# Patient Record
Sex: Female | Born: 1955 | Race: White | Hispanic: No | Marital: Married | Smoking: Current every day smoker
Health system: Southern US, Community
[De-identification: ages and names within clinical notes are randomized; demographics above are authoritative.]

## PROBLEM LIST (undated history)

## (undated) DIAGNOSIS — I10 Essential (primary) hypertension: Secondary | ICD-10-CM

## (undated) DIAGNOSIS — J189 Pneumonia, unspecified organism: Secondary | ICD-10-CM

## (undated) DIAGNOSIS — K219 Gastro-esophageal reflux disease without esophagitis: Secondary | ICD-10-CM

## (undated) DIAGNOSIS — K589 Irritable bowel syndrome without diarrhea: Secondary | ICD-10-CM

## (undated) DIAGNOSIS — O24419 Gestational diabetes mellitus in pregnancy, unspecified control: Secondary | ICD-10-CM

## (undated) DIAGNOSIS — K5790 Diverticulosis of intestine, part unspecified, without perforation or abscess without bleeding: Secondary | ICD-10-CM

## (undated) DIAGNOSIS — H269 Unspecified cataract: Secondary | ICD-10-CM

## (undated) DIAGNOSIS — R7303 Prediabetes: Secondary | ICD-10-CM

## (undated) DIAGNOSIS — R1310 Dysphagia, unspecified: Secondary | ICD-10-CM

## (undated) DIAGNOSIS — R011 Cardiac murmur, unspecified: Secondary | ICD-10-CM

## (undated) DIAGNOSIS — T7840XA Allergy, unspecified, initial encounter: Secondary | ICD-10-CM

## (undated) DIAGNOSIS — F419 Anxiety disorder, unspecified: Secondary | ICD-10-CM

## (undated) HISTORY — DX: Diverticulosis of intestine, part unspecified, without perforation or abscess without bleeding: K57.90

## (undated) HISTORY — DX: Cardiac murmur, unspecified: R01.1

## (undated) HISTORY — PX: COLONOSCOPY: SHX174

## (undated) HISTORY — DX: Gastro-esophageal reflux disease without esophagitis: K21.9

## (undated) HISTORY — DX: Dysphagia, unspecified: R13.10

## (undated) HISTORY — DX: Unspecified cataract: H26.9

## (undated) HISTORY — DX: Prediabetes: R73.03

## (undated) HISTORY — DX: Essential (primary) hypertension: I10

## (undated) HISTORY — PX: UPPER GASTROINTESTINAL ENDOSCOPY: SHX188

## (undated) HISTORY — DX: Irritable bowel syndrome, unspecified: K58.9

## (undated) HISTORY — DX: Gestational diabetes mellitus in pregnancy, unspecified control: O24.419

## (undated) HISTORY — DX: Anxiety disorder, unspecified: F41.9

## (undated) HISTORY — DX: Pneumonia, unspecified organism: J18.9

## (undated) HISTORY — DX: Allergy, unspecified, initial encounter: T78.40XA

---

## 1997-10-02 ENCOUNTER — Other Ambulatory Visit: Admission: RE | Admit: 1997-10-02 | Discharge: 1997-10-02 | Payer: Self-pay | Admitting: Gynecology

## 1999-02-20 ENCOUNTER — Other Ambulatory Visit: Admission: RE | Admit: 1999-02-20 | Discharge: 1999-02-20 | Payer: Self-pay | Admitting: Gynecology

## 1999-11-10 ENCOUNTER — Other Ambulatory Visit: Admission: RE | Admit: 1999-11-10 | Discharge: 1999-11-10 | Payer: Self-pay | Admitting: Gastroenterology

## 2000-05-06 ENCOUNTER — Encounter: Payer: Self-pay | Admitting: Gastroenterology

## 2000-05-06 ENCOUNTER — Ambulatory Visit (HOSPITAL_COMMUNITY): Admission: RE | Admit: 2000-05-06 | Discharge: 2000-05-06 | Payer: Self-pay | Admitting: Gastroenterology

## 2000-05-25 ENCOUNTER — Other Ambulatory Visit: Admission: RE | Admit: 2000-05-25 | Discharge: 2000-05-25 | Payer: Self-pay | Admitting: Gynecology

## 2000-05-25 ENCOUNTER — Encounter: Payer: Self-pay | Admitting: Gastroenterology

## 2000-05-25 ENCOUNTER — Ambulatory Visit (HOSPITAL_COMMUNITY): Admission: RE | Admit: 2000-05-25 | Discharge: 2000-05-25 | Payer: Self-pay | Admitting: Gastroenterology

## 2000-06-02 ENCOUNTER — Encounter: Payer: Self-pay | Admitting: Gastroenterology

## 2000-06-02 ENCOUNTER — Ambulatory Visit (HOSPITAL_COMMUNITY): Admission: RE | Admit: 2000-06-02 | Discharge: 2000-06-02 | Payer: Self-pay | Admitting: Cardiology

## 2000-06-14 ENCOUNTER — Encounter: Payer: Self-pay | Admitting: Gastroenterology

## 2000-06-14 ENCOUNTER — Ambulatory Visit (HOSPITAL_COMMUNITY): Admission: RE | Admit: 2000-06-14 | Discharge: 2000-06-14 | Payer: Self-pay | Admitting: Gastroenterology

## 2002-06-29 ENCOUNTER — Other Ambulatory Visit: Admission: RE | Admit: 2002-06-29 | Discharge: 2002-06-29 | Payer: Self-pay | Admitting: Gynecology

## 2002-10-17 ENCOUNTER — Other Ambulatory Visit: Admission: RE | Admit: 2002-10-17 | Discharge: 2002-10-17 | Payer: Self-pay | Admitting: Gynecology

## 2004-02-21 ENCOUNTER — Other Ambulatory Visit: Admission: RE | Admit: 2004-02-21 | Discharge: 2004-02-21 | Payer: Self-pay | Admitting: Gynecology

## 2005-01-12 ENCOUNTER — Other Ambulatory Visit: Admission: RE | Admit: 2005-01-12 | Discharge: 2005-01-12 | Payer: Self-pay | Admitting: Gynecology

## 2005-12-09 ENCOUNTER — Encounter: Payer: Self-pay | Admitting: Gastroenterology

## 2006-01-26 ENCOUNTER — Ambulatory Visit: Payer: Self-pay | Admitting: Gastroenterology

## 2006-02-09 ENCOUNTER — Ambulatory Visit: Payer: Self-pay | Admitting: Gastroenterology

## 2006-02-16 ENCOUNTER — Other Ambulatory Visit: Admission: RE | Admit: 2006-02-16 | Discharge: 2006-02-16 | Payer: Self-pay | Admitting: Gynecology

## 2007-08-15 ENCOUNTER — Other Ambulatory Visit: Admission: RE | Admit: 2007-08-15 | Discharge: 2007-08-15 | Payer: Self-pay | Admitting: Gynecology

## 2008-05-15 ENCOUNTER — Telehealth: Payer: Self-pay | Admitting: Gastroenterology

## 2008-05-15 ENCOUNTER — Ambulatory Visit: Payer: Self-pay | Admitting: Internal Medicine

## 2008-05-15 DIAGNOSIS — K5732 Diverticulitis of large intestine without perforation or abscess without bleeding: Secondary | ICD-10-CM | POA: Insufficient documentation

## 2008-05-15 DIAGNOSIS — R1032 Left lower quadrant pain: Secondary | ICD-10-CM | POA: Insufficient documentation

## 2008-05-15 LAB — CONVERTED CEMR LAB
Basophils Absolute: 0.1 10*3/uL (ref 0.0–0.1)
Basophils Relative: 0.6 % (ref 0.0–3.0)
Eosinophils Absolute: 0.3 10*3/uL (ref 0.0–0.7)
Eosinophils Relative: 3.2 % (ref 0.0–5.0)
HCT: 37.6 % (ref 36.0–46.0)
Hemoglobin: 13 g/dL (ref 12.0–15.0)
Lymphocytes Relative: 32.6 % (ref 12.0–46.0)
MCHC: 34.5 g/dL (ref 30.0–36.0)
MCV: 95.6 fL (ref 78.0–100.0)
Monocytes Absolute: 0.6 10*3/uL (ref 0.1–1.0)
Monocytes Relative: 7.1 % (ref 3.0–12.0)
Neutro Abs: 4.9 10*3/uL (ref 1.4–7.7)
Neutrophils Relative %: 56.5 % (ref 43.0–77.0)
Platelets: 187 10*3/uL (ref 150–400)
RBC: 3.94 M/uL (ref 3.87–5.11)
RDW: 12.5 % (ref 11.5–14.6)
Sed Rate: 13 mm/hr (ref 0–22)
WBC: 8.7 10*3/uL (ref 4.5–10.5)

## 2008-05-16 ENCOUNTER — Ambulatory Visit (HOSPITAL_COMMUNITY): Admission: RE | Admit: 2008-05-16 | Discharge: 2008-05-16 | Payer: Self-pay | Admitting: Internal Medicine

## 2008-05-21 ENCOUNTER — Telehealth: Payer: Self-pay | Admitting: Gastroenterology

## 2008-05-21 ENCOUNTER — Encounter: Payer: Self-pay | Admitting: Gastroenterology

## 2008-06-20 ENCOUNTER — Ambulatory Visit: Payer: Self-pay | Admitting: Gastroenterology

## 2008-06-20 DIAGNOSIS — R109 Unspecified abdominal pain: Secondary | ICD-10-CM | POA: Insufficient documentation

## 2008-08-02 ENCOUNTER — Telehealth: Payer: Self-pay | Admitting: Gastroenterology

## 2008-08-02 ENCOUNTER — Ambulatory Visit: Payer: Self-pay | Admitting: Gastroenterology

## 2008-08-03 ENCOUNTER — Telehealth: Payer: Self-pay | Admitting: Gastroenterology

## 2008-08-15 ENCOUNTER — Ambulatory Visit: Payer: Self-pay | Admitting: Gastroenterology

## 2008-08-15 DIAGNOSIS — K222 Esophageal obstruction: Secondary | ICD-10-CM | POA: Insufficient documentation

## 2010-06-30 ENCOUNTER — Telehealth: Payer: Self-pay | Admitting: Gastroenterology

## 2010-07-09 NOTE — Progress Notes (Signed)
Summary: speak directly to nurse  Phone Note Call from Patient Call back at Work Phone [phone]   Caller: Patient Call For: Dr Arlyce Dice Reason for Call: Talk to Nurse Complaint: Breathing Problems Summary of Call: Patient wants to speak directly to nurse Initial call taken by: Tawni Levy,  June 30, 2010 1:46 PM  Follow-up for Phone Call        Patient states that she started having quite a bit of pain on her left side this weekend, states she usually has it but it was worse. She started Cipro 500mg  Sat night and continued two times a day until this am. She is also taking Hydrocodone 10/325 every 4-5 hours and still states that she feels the pain. Patient wants to make sure she is doing what she needs to and wanted to find out how long she should take the Cipro. Dr. Arlyce Dice please advise. Follow-up by: Selinda Michaels RN,  June 30, 2010 2:04 PM  Additional Follow-up for Phone Call Additional follow up Details #1::        I'm not convinced that she has diverticulitis. Get CT ASAP; if negative d/c antibiotics Additional Follow-up by: Louis Meckel MD,  June 30, 2010 4:24 PM    Additional Follow-up for Phone Call Additional follow up Details #2::    Left message to call back  Spoke with patient and she states she is feeling better this morning. Patient wants to continue to wait and see if she continues to improve, states if she does not she will call us back to have CT scan. Selinda Michaels, RN 07/01/10@9 :53am Follow-up by: Selinda Michaels RN,  June 30, 2010 4:36 PM  Additional Follow-up for Phone Call Additional follow up Details #3:: Details for Additional Follow-up Action Taken: lets hold off her cipro, then Additional Follow-up by: Louis Meckel MD,  July 01, 2010 3:02 PM   Appended Document: speak directly to nurse Patient notified to stop her Cipro. Patient will call if pain continues and thinks she needs the CT scan.

## 2010-10-03 NOTE — Assessment & Plan Note (Signed)
[facility]                           GASTROENTEROLOGY OFFICE NOTE   JAYLEI, FUERTE                      MRN:          [id number]  DATE:01/26/2006                            DOB:          1955-09-18    PROBLEM:  Diarrhea.   Ms. Weinheimer is a pleasant 55 year old white female referred through the  courtesy of Dr. Samuel Germany for evaluation.  Over the last 4 months, she has been  complaining of abdominal bloating, occasional upper abdominal pain and  diarrhea.  She is now having 1-3 poorly formed, or frankly loose stools  daily.  She has occasional urgency.  There is no history of melena or  hematochezia.  She does have a history of IBS.  She underwent extensive  workup including upper and lower endoscopy, random colon biopsies, abdominal  ultrasound and an upper GI and small-bowel follow-through and CT scan.  No  specific abnormalities were seen except for a hiatal hernia.  Stool studies  for O&P and C&S and C. difficile toxin were negative.  She was placed on  Nexium twice a day without improvement in her symptoms.  Lab work was also  unremarkable, except for an elevated C-reactive protein (25.2).  CBC and  CMET were normal.  She has actually lost 20 pounds in the last few months.  Appetite is just fair.  Altogether, she is just not feeling well.   PAST MEDICAL HISTORY:  Unremarkable.  She is status post C-section.   FAMILY HISTORY:  Noncontributory.   MEDICATIONS:  1. Nexium 40 mg twice a day.  2. Ambien.   ALLERGIES:  She is allergic to IVP DYE and Lasix.   She neither smokes nor drinks.  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   REVIEW OF SYSTEMS:  Positive for insomnia and mild anxiety.   PHYSICAL EXAMINATION:  VITAL SIGNS:  Pulse is 76, blood pressure 110/70.  Weight 193.   PHYSICAL EXAMINATION:  HEENT: EOMI. PERRLA. Sclerae are anicteric.  Conjunctivae are pink.  NECK:  Supple without thyromegaly, adenopathy or  carotid bruits.  CHEST:  Clear to auscultation and percussion without adventitious sounds.  CARDIAC:  Regular rhythm; normal S1 S2.  There are no murmurs, gallops or  rubs.  ABDOMEN:  She has a small umbilical hernia.  There is mild right and left  lower quadrant tenderness without guarding or rebound.  There are no  abdominal masses or organomegaly.  EXTREMITIES:  Full range of motion.  No cyanosis, clubbing or edema.  RECTAL:  Deferred.   IMPRESSION:  Change in bowel habits consisting of mild chronic diarrhea with  weight loss and abdominal bloating.  Extensive workup has been negative to  date.  Irritable bowel syndrome is certainly a possibility.  High-dose  Nexium may be contributory to symptoms of nausea, pain and diarrhea.   RECOMMENDATION:  1. Discontinue Nexium.  2. Trial of  Xifaxan 400 mg twice a day.  Barbette Hair. Arlyce Dice, MD,FACG   RDK/MedQ  DD:  01/26/2006  DT:  01/27/2006  Job #:  [id number]   cc:   Renae Fickle

## 2010-10-03 NOTE — Assessment & Plan Note (Signed)
[facility]                           GASTROENTEROLOGY OFFICE NOTE   RANDEE, HUSTON                      MRN:          [id number]  DATE:02/09/2006                            DOB:          [birth year]    PROBLEM:  Diarrhea, resolved.   Mrs. Sigler has returned for a scheduled GI follow-up.  Diarrhea has  entirely subsided.  She no longer has bloating.  Since discontinuing her  Nexium, she has developed nausea and pyrosis.  She completed a 14 day course  of  Xifaxan.   PHYSICAL EXAMINATION:  VITAL SIGNS:  Pulse 82, blood pressure 110/64, weight  188.   IMPRESSION:  1. Diarrhea - resolved:  This could be due to irritable bowel syndrome.      Nexium may also be a causative factor.  2. Pyrosis and nausea:  I suspect this is related to acid reflux.   RECOMMENDATIONS:  Resume Nexium 40 mg daily.  I carefully instructed Mrs.  Weyenberg to contact me if her diarrhea and bloating recur or if he nausea and  pyrosis do not subside.  Should the latter be the case, then I would  consider a gastric emptying scan.                                   Barbette Hair. Arlyce Dice, MD,FACG   RDK/MedQ  DD:  02/09/2006  DT:  02/11/2006  Job #:  [id number]   cc:   Renae Fickle

## 2014-03-19 ENCOUNTER — Encounter: Payer: Self-pay | Admitting: Gastroenterology

## 2015-09-17 DIAGNOSIS — J189 Pneumonia, unspecified organism: Secondary | ICD-10-CM | POA: Diagnosis not present

## 2015-09-17 DIAGNOSIS — M545 Low back pain: Secondary | ICD-10-CM | POA: Diagnosis not present

## 2015-09-17 DIAGNOSIS — J9801 Acute bronchospasm: Secondary | ICD-10-CM | POA: Diagnosis not present

## 2015-09-17 DIAGNOSIS — E669 Obesity, unspecified: Secondary | ICD-10-CM | POA: Diagnosis not present

## 2016-01-09 DIAGNOSIS — T148 Other injury of unspecified body region: Secondary | ICD-10-CM | POA: Diagnosis not present

## 2016-01-09 DIAGNOSIS — M545 Low back pain: Secondary | ICD-10-CM | POA: Diagnosis not present

## 2016-01-09 DIAGNOSIS — H6120 Impacted cerumen, unspecified ear: Secondary | ICD-10-CM | POA: Diagnosis not present

## 2016-01-09 DIAGNOSIS — R21 Rash and other nonspecific skin eruption: Secondary | ICD-10-CM | POA: Diagnosis not present

## 2016-01-09 DIAGNOSIS — B373 Candidiasis of vulva and vagina: Secondary | ICD-10-CM | POA: Diagnosis not present

## 2016-01-09 DIAGNOSIS — L819 Disorder of pigmentation, unspecified: Secondary | ICD-10-CM | POA: Diagnosis not present

## 2016-04-02 DIAGNOSIS — R079 Chest pain, unspecified: Secondary | ICD-10-CM | POA: Diagnosis not present

## 2016-04-02 DIAGNOSIS — R0602 Shortness of breath: Secondary | ICD-10-CM | POA: Diagnosis not present

## 2016-04-02 DIAGNOSIS — R0789 Other chest pain: Secondary | ICD-10-CM | POA: Diagnosis not present

## 2016-04-07 DIAGNOSIS — L570 Actinic keratosis: Secondary | ICD-10-CM | POA: Diagnosis not present

## 2016-04-07 DIAGNOSIS — M545 Low back pain: Secondary | ICD-10-CM | POA: Diagnosis not present

## 2016-04-07 DIAGNOSIS — Z6832 Body mass index (BMI) 32.0-32.9, adult: Secondary | ICD-10-CM | POA: Diagnosis not present

## 2016-04-07 DIAGNOSIS — Z7189 Other specified counseling: Secondary | ICD-10-CM | POA: Diagnosis not present

## 2016-07-07 DIAGNOSIS — F419 Anxiety disorder, unspecified: Secondary | ICD-10-CM | POA: Diagnosis not present

## 2016-07-07 DIAGNOSIS — E559 Vitamin D deficiency, unspecified: Secondary | ICD-10-CM | POA: Diagnosis not present

## 2016-07-07 DIAGNOSIS — B373 Candidiasis of vulva and vagina: Secondary | ICD-10-CM | POA: Diagnosis not present

## 2016-07-07 DIAGNOSIS — M545 Low back pain: Secondary | ICD-10-CM | POA: Diagnosis not present

## 2016-09-30 DIAGNOSIS — B373 Candidiasis of vulva and vagina: Secondary | ICD-10-CM | POA: Diagnosis not present

## 2016-09-30 DIAGNOSIS — E559 Vitamin D deficiency, unspecified: Secondary | ICD-10-CM | POA: Diagnosis not present

## 2016-09-30 DIAGNOSIS — R102 Pelvic and perineal pain: Secondary | ICD-10-CM | POA: Diagnosis not present

## 2016-09-30 DIAGNOSIS — E538 Deficiency of other specified B group vitamins: Secondary | ICD-10-CM | POA: Diagnosis not present

## 2016-09-30 DIAGNOSIS — Z23 Encounter for immunization: Secondary | ICD-10-CM | POA: Diagnosis not present

## 2016-09-30 DIAGNOSIS — J9801 Acute bronchospasm: Secondary | ICD-10-CM | POA: Diagnosis not present

## 2016-12-15 DIAGNOSIS — E559 Vitamin D deficiency, unspecified: Secondary | ICD-10-CM | POA: Diagnosis not present

## 2016-12-15 DIAGNOSIS — R739 Hyperglycemia, unspecified: Secondary | ICD-10-CM | POA: Diagnosis not present

## 2016-12-15 DIAGNOSIS — F419 Anxiety disorder, unspecified: Secondary | ICD-10-CM | POA: Diagnosis not present

## 2016-12-15 DIAGNOSIS — M545 Low back pain: Secondary | ICD-10-CM | POA: Diagnosis not present

## 2016-12-15 DIAGNOSIS — Z79899 Other long term (current) drug therapy: Secondary | ICD-10-CM | POA: Diagnosis not present

## 2016-12-15 DIAGNOSIS — R030 Elevated blood-pressure reading, without diagnosis of hypertension: Secondary | ICD-10-CM | POA: Diagnosis not present

## 2016-12-17 DIAGNOSIS — E538 Deficiency of other specified B group vitamins: Secondary | ICD-10-CM | POA: Diagnosis not present

## 2017-03-30 DIAGNOSIS — M545 Low back pain: Secondary | ICD-10-CM | POA: Diagnosis not present

## 2017-03-30 DIAGNOSIS — J9801 Acute bronchospasm: Secondary | ICD-10-CM | POA: Diagnosis not present

## 2017-03-30 DIAGNOSIS — R110 Nausea: Secondary | ICD-10-CM | POA: Diagnosis not present

## 2017-03-30 DIAGNOSIS — B373 Candidiasis of vulva and vagina: Secondary | ICD-10-CM | POA: Diagnosis not present

## 2017-04-19 ENCOUNTER — Encounter: Payer: Self-pay | Admitting: Physician Assistant

## 2017-04-26 ENCOUNTER — Ambulatory Visit: Payer: Self-pay | Admitting: Physician Assistant

## 2017-05-06 ENCOUNTER — Encounter: Payer: Self-pay | Admitting: Physician Assistant

## 2017-05-06 ENCOUNTER — Ambulatory Visit: Payer: BLUE CROSS/BLUE SHIELD | Admitting: Physician Assistant

## 2017-05-06 ENCOUNTER — Ambulatory Visit: Payer: Self-pay | Admitting: Physician Assistant

## 2017-05-06 ENCOUNTER — Other Ambulatory Visit (INDEPENDENT_AMBULATORY_CARE_PROVIDER_SITE_OTHER): Payer: BLUE CROSS/BLUE SHIELD

## 2017-05-06 VITALS — BP 156/90 | HR 88 | Ht 65.35 in | Wt 212.5 lb

## 2017-05-06 DIAGNOSIS — R1011 Right upper quadrant pain: Secondary | ICD-10-CM

## 2017-05-06 DIAGNOSIS — R112 Nausea with vomiting, unspecified: Secondary | ICD-10-CM

## 2017-05-06 DIAGNOSIS — R6881 Early satiety: Secondary | ICD-10-CM

## 2017-05-06 LAB — CBC WITH DIFFERENTIAL/PLATELET
BASOS ABS: 0.1 10*3/uL (ref 0.0–0.1)
Basophils Relative: 1.5 % (ref 0.0–3.0)
EOS ABS: 0.5 10*3/uL (ref 0.0–0.7)
Eosinophils Relative: 6.1 % — ABNORMAL HIGH (ref 0.0–5.0)
HCT: 43.1 % (ref 36.0–46.0)
Hemoglobin: 14.3 g/dL (ref 12.0–15.0)
LYMPHS ABS: 3.1 10*3/uL (ref 0.7–4.0)
LYMPHS PCT: 38.2 % (ref 12.0–46.0)
MCHC: 33.1 g/dL (ref 30.0–36.0)
MCV: 94.4 fl (ref 78.0–100.0)
MONO ABS: 0.5 10*3/uL (ref 0.1–1.0)
Monocytes Relative: 6.5 % (ref 3.0–12.0)
NEUTROS ABS: 3.8 10*3/uL (ref 1.4–7.7)
NEUTROS PCT: 47.7 % (ref 43.0–77.0)
PLATELETS: 231 10*3/uL (ref 150.0–400.0)
RBC: 4.56 Mil/uL (ref 3.87–5.11)
RDW: 12.6 % (ref 11.5–15.5)
WBC: 8 10*3/uL (ref 4.0–10.5)

## 2017-05-06 LAB — COMPREHENSIVE METABOLIC PANEL
ALT: 175 U/L — AB (ref 0–35)
AST: 74 U/L — AB (ref 0–37)
Albumin: 4 g/dL (ref 3.5–5.2)
Alkaline Phosphatase: 334 U/L — ABNORMAL HIGH (ref 39–117)
BILIRUBIN TOTAL: 0.6 mg/dL (ref 0.2–1.2)
BUN: 10 mg/dL (ref 6–23)
CALCIUM: 8.9 mg/dL (ref 8.4–10.5)
CO2: 32 meq/L (ref 19–32)
CREATININE: 0.9 mg/dL (ref 0.40–1.20)
Chloride: 102 mEq/L (ref 96–112)
GFR: 67.98 mL/min (ref >60.00)
GLUCOSE: 149 mg/dL — AB (ref 70–99)
Potassium: 3.7 mEq/L (ref 3.5–5.1)
Sodium: 138 mEq/L (ref 135–145)
Total Protein: 7.2 g/dL (ref 6.0–8.3)

## 2017-05-06 LAB — LIPASE: Lipase: 11 U/L (ref 11.0–59.0)

## 2017-05-06 MED ORDER — PANTOPRAZOLE SODIUM 40 MG PO TBEC: DELAYED_RELEASE_TABLET | ORAL | 3 refills | Status: DC

## 2017-05-06 MED ORDER — PROMETHAZINE HCL 25 MG PO TABS: 12.5000 mg | ORAL_TABLET | Freq: Four times a day (QID) | ORAL | 1 refills | Status: AC | PRN

## 2017-05-06 MED ORDER — HYOSCYAMINE SULFATE SL 0.125 MG SL SUBL: SUBLINGUAL_TABLET | SUBLINGUAL | 1 refills | Status: AC

## 2017-05-06 NOTE — Patient Instructions (Signed)
Please go to the basement level to have your labs drawn.  We sent prescription  Refills to your pharmacy. 1. Pantoprazole sodium 40 mg.  2. Phenergan 3. Levsin SL  You have been scheduled for an abdominal ultrasound at [facility] (1st floor of hospital) on Wed 05-19-2017 at 9:00 am. Please arrive at 8:45 am to your appointment for registration. Make certain not to have anything to eat or drink after midnight. Should you need to reschedule your appointment, please contact radiology at [phone]. This test typically takes about 30 minutes to perform.  Please call us about making an appointment to see Nicoletta Ba PA the end of January. Her scheduled is not out yet. We suggest calling early January.

## 2017-05-06 NOTE — Progress Notes (Addendum)
Subjective:    Patient ID: Kathy Ferguson, female    DOB: [birth year], 61 y.o.   MRN: [id number]  HPI Kathy Ferguson is a very nice 61 year old white female, new to GI today, self-referred for evaluation of recent onset of upper abdominal pain episodes, frequent nausea queasiness and some early satiety. Patient is known previously to Dr. Deatra Ina but has not been seen in our office since 2012.  She has history of hypertension, anxiety and recurrent UTIs.  She had undergone EGD in 2010 with finding of an early esophageal stricture at the GE junction which was not dilated and a small hiatal hernia.  Colonoscopy was done in 2007 in [place], [place] ,we do have a copy of that report, this was a normal exam, random biopsies were done for complaints of diarrhea and those were negative also. Patient says she had her first episode of intense right upper quadrant pain on March 20, 2017.  This episode lasted for a couple of hours and was associated with nausea but no vomiting. Since that episode she has been having stomach symptoms with "7 out of 10" meals with fullness bloating and generalized upper abdominal discomfort postprandially and very frequent queasiness.  She had a bad episode recently after eating barbecue eventually developed a lot of gas and her symptoms gradually subsided.  She has had at least one other more intense attack with nonradiating right upper quadrant pain about 2 weeks ago.  She also feels that she has been having an increase in acid reflux symptoms.  She has a lot of back problems and normally sleeps in a recliner and feels this also will help if she is having reflux. She has not been on any regular aspirin or NSAIDs.  She has been eating a lot of Pepcid AC with some benefit.  She has been given a short supply of Phenergan for her nausea which has been helpful.  She does not respond to Zofran. Patient is concerned because she states she is going on a long vacation to [place] at the  beginning of January and wants to know if there is anything that she can take in case she gets a bad episode while she is away.  Review of Systems Pertinent positive and negative review of systems were noted in the above HPI section.  All other review of systems was otherwise negative.  Outpatient Encounter Medications as of 05/06/2017  Medication Sig  . busPIRone (BUSPAR) 15 MG tablet Take 30 mg by mouth 2 (two) times daily.  . cyclobenzaprine (FLEXERIL) 10 MG tablet Take 10 mg by mouth as needed.   Marland Kitchen HYDROcodone-acetaminophen (NORCO) 10-325 MG tablet Take 1 tablet by mouth every 4-6 hours as needed  . nitrofurantoin (MACRODANTIN) 50 MG capsule Take 1 capsule by mouth as needed after intercourse  . PROAIR HFA 108 (90 Base) MCG/ACT inhaler inahle 2 puff every 4-6 hours as needed  . promethazine (PHENERGAN) 25 MG tablet Take 0.5 tablets (12.5 mg total) by mouth every 6 (six) hours as needed.  . [DISCONTINUED] promethazine (PHENERGAN) 25 MG tablet Take 0.5 tablets by mouth every 6 (six) hours as needed.  . ciprofloxacin (CIPRO) 500 MG tablet Take 1 tablet by mouth 2 (two) times daily as needed (for diarrhea or UTI).   . fluconazole (DIFLUCAN) 150 MG tablet Take 1 tablet by mouth daily as needed.  Marland Kitchen Hyoscyamine Sulfate SL (LEVSIN/SL) 0.125 MG SUBL Dissolve 1 tablet on the tongue every 4-6 hours as needed for acute abdominal pain/spasms.  Marland Kitchen  pantoprazole (PROTONIX) 40 MG tablet Take 1 tablet by mouth every morning.   No facility-administered encounter medications on file as of 05/06/2017.    Allergies  Allergen Reactions  . Iohexol      Code: HIVES, Desc: pt has hives/swelling with iv cm, Onset Date: 07371062   . Latex    Patient Active Problem List   Diagnosis Date Noted  . STRICTURE AND STENOSIS OF ESOPHAGUS 08/15/2008  . ABDOMINAL WALL PAIN 06/20/2008  . Diverticulitis of colon (without mention of hemorrhage)(562.11) 05/15/2008  . Abdominal pain, left lower quadrant 05/15/2008    Social History   Socioeconomic History  . Marital status: Married    Spouse name: Not on file  . Number of children: 2  . Years of education: Not on file  . Highest education level: Not on file  Social Needs  . Financial resource strain: Not on file  . Food insecurity - worry: Not on file  . Food insecurity - inability: Not on file  . Transportation needs - medical: Not on file  . Transportation needs - non-medical: Not on file  Occupational History  . Occupation: Hotel manager  Tobacco Use  . Smoking status: Current Every Day Smoker  . Smokeless tobacco: Never Used  Substance and Sexual Activity  . Alcohol use: Yes    Comment: occasional  . Drug use: No  . Sexual activity: Not on file  Other Topics Concern  . Not on file  Social History Narrative  . Not on file    Kathy Ferguson family history includes Breast cancer in her mother; Colon polyps in her mother; Diabetes in her brother, father, maternal grandmother, other, paternal aunt, paternal aunt, paternal uncle, and paternal uncle; Stroke in her father, maternal grandmother, other, paternal aunt, paternal aunt, paternal uncle, and paternal uncle.      Objective:    Vitals:   05/06/17 1349  BP: (!) 156/90  Pulse: 88    Physical Exam well-developed white female in no acute distress, pleasant blood pressure 156/90 pulse 88, height 5 foot 5, weight 212, BMI 34.9.  HEENT nontraumatic normocephalic EOMI PERRLA sclera anicteric, Cardiovascular regular rate and rhythm with S1-S2 no murmur rub or gallop, Pulmonary; clear bilaterally, Abdomen large, soft she has some mild tenderness in the epigastrium and right upper quadrant there is no guarding or rebound no palpable mass or hepatosplenomegaly bowel sounds are present, Rectal exam not done, Extremities no clubbing cyanosis or edema skin warm and dry, Neuro psych mood and affect appropriate       Assessment & Plan:   #30  61 year old white female with 6-week history  of intermittent episodes of postprandial right upper quadrant pain and nausea, and very frequent episodes of postprandial discomfort queasiness and early satiety. Etiology of symptoms is not clear, symptoms are certainly consistent with biliary colic which will need to be ruled out.  Will also need to rule out gastropathy peptic ulcer disease and/or poorly controlled reflux. #2 hypertension 3.  Recurrent UTIs 4.  Obesity 5.  Anxiety 6.  Colon cancer surveillance-last colonoscopy 2007 normal done in [facility].  She will need follow-up colonoscopy at some point once acute symptoms are sorted out.  Plan; Start Protonix 40 mg p.o. every morning Will refill Phenergan, 12.5-25 mg every 6 hours as needed for nausea We will also give her a trial of Levsin sublingual to use on a as needed basis for episodes of more acute abdominal pain. CBC with differential, CMET, lipase today We will  schedule for upper abdominal ultrasound. I will plan to see her in follow-up after she returns from her trip towards the end of January. Patient will be established with Dr. Hilarie Fredrickson.    Amy S Esterwood PA-C 05/06/2017   Addendum: Reviewed and agree with initial management. Pyrtle, Lajuan Lines, MD

## 2017-05-18 HISTORY — PX: ERCP: SHX60

## 2017-05-19 ENCOUNTER — Other Ambulatory Visit: Payer: Self-pay

## 2017-05-19 ENCOUNTER — Telehealth: Payer: Self-pay | Admitting: Physician Assistant

## 2017-05-19 ENCOUNTER — Ambulatory Visit (HOSPITAL_COMMUNITY)
Admission: RE | Admit: 2017-05-19 | Discharge: 2017-05-19 | Disposition: A | Discharge status: Home / Self Care | Payer: BLUE CROSS/BLUE SHIELD | Source: Ambulatory Visit | Attending: Physician Assistant | Admitting: Physician Assistant

## 2017-05-19 ENCOUNTER — Telehealth: Payer: Self-pay

## 2017-05-19 DIAGNOSIS — R6881 Early satiety: Secondary | ICD-10-CM

## 2017-05-19 DIAGNOSIS — K8070 Calculus of gallbladder and bile duct without cholecystitis without obstruction: Secondary | ICD-10-CM | POA: Diagnosis not present

## 2017-05-19 DIAGNOSIS — K7460 Unspecified cirrhosis of liver: Secondary | ICD-10-CM

## 2017-05-19 DIAGNOSIS — R1011 Right upper quadrant pain: Secondary | ICD-10-CM | POA: Diagnosis not present

## 2017-05-19 DIAGNOSIS — R112 Nausea with vomiting, unspecified: Secondary | ICD-10-CM

## 2017-05-19 DIAGNOSIS — K8020 Calculus of gallbladder without cholecystitis without obstruction: Secondary | ICD-10-CM | POA: Diagnosis not present

## 2017-05-19 MED ORDER — CIPROFLOXACIN HCL 500 MG PO TABS: 500.0000 mg | ORAL_TABLET | Freq: Two times a day (BID) | ORAL | 0 refills | Status: DC

## 2017-05-19 NOTE — Telephone Encounter (Signed)
Left the information on her v/m-Need her pharmacy also.

## 2017-05-19 NOTE — Telephone Encounter (Signed)
Ok to give her a course of Cipro 500 mg po BID to take x 5 days   to take with her . She needs to understand this is not fixing the problem , and if she gets sick while out of country will need to seek medical care there . Please find out when she will be back  So we can get her scheduled for ERCP /stone extraction soon thereafter , and can also get her an appt to see surgery shortly after return home

## 2017-05-19 NOTE — Telephone Encounter (Signed)
Amy Esterwood, PA-C has been contacted about the imaging. [facility] notified.

## 2017-05-19 NOTE — Telephone Encounter (Signed)
Notes recorded by Irene Shipper, MD on 05/19/2017 at 12:01 PM EST Amy, I have reviewed her imaging and laboratories. The next best step is ERCP for common duct stone removal to be followed by laparoscopic cholecystectomy at some point in the near future thereafter. I am in the hospital next week. Please arrange ERCP with general anesthesia as outpatient at [facility] on any day except Monday. She will need procedure description information to review ahead of time. She will need preprocedure antibiotic such as Cipro 400 mg IV. She should pack a bag in case she needs to be admitted thereafter, but typically if there are no problems she will be discharged that day. If she develops severe interval pain, jaundice, or fever she should proceed to the emergency room. Keep me posted. Thank you  Spoke with the patient. She declines the recommendations. She is leaving the country tomorrow. Her request is for an antibiotic to take with her to take if she becomes symptomatic with "another attack" or feverish.  She could not talk longer. She is going to another appointment but will be available after 4:15. Alternate number is cell phone or [phone] but after 4:15 pm. Please advise.

## 2017-05-19 NOTE — Telephone Encounter (Signed)
Spoke with the patient. Tried to answer her multiple questions. She is going to discuss cancelling her trip to [place]. She will wait for a call tomorrow around 9 or 10 am to talk with me.

## 2017-05-20 NOTE — Telephone Encounter (Signed)
Spoke to the patient. She and her spouse have decided to go on the trip to [place]. Agrees to set up her patient portal access through "My Chart."  She is requesting the ERCP be set up for the first week in February.  Please advise on this.

## 2017-05-20 NOTE — Telephone Encounter (Signed)
Called patient's cell phone. No answer. Left message.

## 2017-05-21 NOTE — Telephone Encounter (Signed)
Beth, I spoke to Dr Hilarie Fredrickson - He would like to ask Dr Ardis Hughs to do ERCP /Stone extraction  Hopefully first week of February.  Will forward this to Dr Ardis Hughs for review , and ask him for date .    Please go ahead with appt with [facility] as well  Linna Hoff - thank you!

## 2017-05-21 NOTE — Telephone Encounter (Addendum)
Records faxed to [facility] with very specific information about the time frame and when the patient will be available. I have asked the appointment information be left on her voicemail.

## 2017-05-23 NOTE — Telephone Encounter (Signed)
All, I can help her on Thursday Feb 7th with ERCP at [facility]. If her situation becomes more urgent and she cannot wait until that appt, will have to contact whichever MD is covering biliary cases to coordinate).    Patty,  See above (ERCP Thursday Feb 7th for cbd stone).

## 2017-05-25 ENCOUNTER — Other Ambulatory Visit: Payer: Self-pay

## 2017-05-25 ENCOUNTER — Telehealth: Payer: Self-pay | Admitting: Physician Assistant

## 2017-05-25 DIAGNOSIS — R109 Unspecified abdominal pain: Secondary | ICD-10-CM | POA: Diagnosis not present

## 2017-05-25 DIAGNOSIS — K8050 Calculus of bile duct without cholangitis or cholecystitis without obstruction: Secondary | ICD-10-CM

## 2017-05-25 DIAGNOSIS — R1011 Right upper quadrant pain: Secondary | ICD-10-CM | POA: Diagnosis not present

## 2017-05-25 DIAGNOSIS — R110 Nausea: Secondary | ICD-10-CM | POA: Diagnosis not present

## 2017-05-25 DIAGNOSIS — K8064 Calculus of gallbladder and bile duct with chronic cholecystitis without obstruction: Secondary | ICD-10-CM | POA: Diagnosis not present

## 2017-05-25 DIAGNOSIS — K8070 Calculus of gallbladder and bile duct without cholecystitis without obstruction: Secondary | ICD-10-CM | POA: Diagnosis not present

## 2017-05-25 DIAGNOSIS — R17 Unspecified jaundice: Secondary | ICD-10-CM | POA: Diagnosis not present

## 2017-05-25 DIAGNOSIS — F17210 Nicotine dependence, cigarettes, uncomplicated: Secondary | ICD-10-CM | POA: Diagnosis not present

## 2017-05-25 NOTE — Telephone Encounter (Signed)
ERCP scheduled for 27/19 730 am

## 2017-05-25 NOTE — Telephone Encounter (Signed)
Orlin Hilding [facility] ED will be able to see records in [place].

## 2017-05-25 NOTE — Telephone Encounter (Signed)
Patient is now on a plane from [place] to [place] and will check her messages when she lands. Please advise.

## 2017-05-25 NOTE — Telephone Encounter (Signed)
Spoke to patient, advised to go to the ER in [place]. Patient was not pleased with this recommendation, but explained the urgency to her and she understands, stated she would go to ER in [place].

## 2017-05-25 NOTE — Telephone Encounter (Signed)
She needs to go to an ER in [place]. She should not wait until she gets all the way back to [place].  There is a good chance she has stone related ascending cholangitis.

## 2017-05-25 NOTE — Telephone Encounter (Signed)
ERCP scheduled, pt instructed and medications reviewed.  Patient instructions mailed to home.  Patient to call with any questions or concerns.  

## 2017-05-25 NOTE — Telephone Encounter (Signed)
Patient called back stating that she is not going to the ED in [place]. She says that she will go to the ED at [facility] when she arrives there. She is requesting that our office send her records to [facility] ED.

## 2017-05-26 DIAGNOSIS — R110 Nausea: Secondary | ICD-10-CM | POA: Diagnosis not present

## 2017-05-26 DIAGNOSIS — G8929 Other chronic pain: Secondary | ICD-10-CM | POA: Diagnosis not present

## 2017-05-26 DIAGNOSIS — K8064 Calculus of gallbladder and bile duct with chronic cholecystitis without obstruction: Secondary | ICD-10-CM | POA: Diagnosis not present

## 2017-05-26 DIAGNOSIS — Z9889 Other specified postprocedural states: Secondary | ICD-10-CM | POA: Diagnosis not present

## 2017-05-26 DIAGNOSIS — Z6833 Body mass index (BMI) 33.0-33.9, adult: Secondary | ICD-10-CM | POA: Diagnosis not present

## 2017-05-26 DIAGNOSIS — R17 Unspecified jaundice: Secondary | ICD-10-CM | POA: Diagnosis not present

## 2017-05-26 DIAGNOSIS — K469 Unspecified abdominal hernia without obstruction or gangrene: Secondary | ICD-10-CM | POA: Diagnosis not present

## 2017-05-26 DIAGNOSIS — R159 Full incontinence of feces: Secondary | ICD-10-CM | POA: Diagnosis not present

## 2017-05-26 DIAGNOSIS — J449 Chronic obstructive pulmonary disease, unspecified: Secondary | ICD-10-CM | POA: Diagnosis not present

## 2017-05-26 DIAGNOSIS — K819 Cholecystitis, unspecified: Secondary | ICD-10-CM | POA: Diagnosis not present

## 2017-05-26 DIAGNOSIS — K589 Irritable bowel syndrome without diarrhea: Secondary | ICD-10-CM | POA: Diagnosis not present

## 2017-05-26 DIAGNOSIS — K219 Gastro-esophageal reflux disease without esophagitis: Secondary | ICD-10-CM | POA: Diagnosis not present

## 2017-05-26 DIAGNOSIS — Z87440 Personal history of urinary (tract) infections: Secondary | ICD-10-CM | POA: Diagnosis not present

## 2017-05-26 DIAGNOSIS — K811 Chronic cholecystitis: Secondary | ICD-10-CM | POA: Diagnosis not present

## 2017-05-26 DIAGNOSIS — R1011 Right upper quadrant pain: Secondary | ICD-10-CM | POA: Diagnosis not present

## 2017-05-26 DIAGNOSIS — K8050 Calculus of bile duct without cholangitis or cholecystitis without obstruction: Secondary | ICD-10-CM | POA: Diagnosis not present

## 2017-05-26 DIAGNOSIS — F419 Anxiety disorder, unspecified: Secondary | ICD-10-CM | POA: Diagnosis not present

## 2017-05-26 DIAGNOSIS — F17210 Nicotine dependence, cigarettes, uncomplicated: Secondary | ICD-10-CM | POA: Diagnosis not present

## 2017-05-26 DIAGNOSIS — K5790 Diverticulosis of intestine, part unspecified, without perforation or abscess without bleeding: Secondary | ICD-10-CM | POA: Diagnosis not present

## 2017-05-26 DIAGNOSIS — I10 Essential (primary) hypertension: Secondary | ICD-10-CM | POA: Diagnosis not present

## 2017-05-26 DIAGNOSIS — K8070 Calculus of gallbladder and bile duct without cholecystitis without obstruction: Secondary | ICD-10-CM | POA: Diagnosis not present

## 2017-05-26 DIAGNOSIS — K838 Other specified diseases of biliary tract: Secondary | ICD-10-CM | POA: Diagnosis not present

## 2017-05-26 DIAGNOSIS — E669 Obesity, unspecified: Secondary | ICD-10-CM | POA: Diagnosis not present

## 2017-05-26 MED ORDER — GENERIC EXTERNAL MEDICATION
2.00 | Status: DC
Start: ? — End: 2017-05-26

## 2017-05-26 MED ORDER — MORPHINE SULFATE 4 MG/ML IJ SOLN
2.00 mg | INTRAMUSCULAR | Status: DC
Start: ? — End: 2017-05-26

## 2017-05-26 MED ORDER — PIPERACILLIN SOD-TAZOBACTAM SO 3.375 (3-0.375) G IV SOLR
3.38 | INTRAVENOUS | Status: DC
Start: 2017-05-27 — End: 2017-05-26

## 2017-05-26 MED ORDER — NICOTINE 21 MG/24HR TD PT24
1.00 | MEDICATED_PATCH | TRANSDERMAL | Status: DC
Start: 2017-05-27 — End: 2017-05-26

## 2017-05-26 MED ORDER — PANTOPRAZOLE SODIUM 40 MG PO TBEC
40.00 mg | DELAYED_RELEASE_TABLET | ORAL | Status: DC
Start: 2017-05-27 — End: 2017-05-26

## 2017-05-26 MED ORDER — ONDANSETRON 4 MG PO TBDP
4.00 mg | ORAL_TABLET | ORAL | Status: DC
Start: ? — End: 2017-05-26

## 2017-05-26 MED ORDER — DEXTROSE-NACL 5-0.45 % IV SOLN
75.00 | INTRAVENOUS | Status: DC
Start: ? — End: 2017-05-26

## 2017-05-26 NOTE — Telephone Encounter (Signed)
Thank you :)

## 2017-05-27 ENCOUNTER — Ambulatory Visit: Payer: Self-pay | Admitting: Physician Assistant

## 2017-05-27 DIAGNOSIS — F17210 Nicotine dependence, cigarettes, uncomplicated: Secondary | ICD-10-CM | POA: Diagnosis not present

## 2017-05-27 DIAGNOSIS — K8050 Calculus of bile duct without cholangitis or cholecystitis without obstruction: Secondary | ICD-10-CM | POA: Diagnosis not present

## 2017-05-27 DIAGNOSIS — Z9889 Other specified postprocedural states: Secondary | ICD-10-CM | POA: Diagnosis not present

## 2017-05-27 DIAGNOSIS — K8064 Calculus of gallbladder and bile duct with chronic cholecystitis without obstruction: Secondary | ICD-10-CM | POA: Diagnosis not present

## 2017-05-27 DIAGNOSIS — F419 Anxiety disorder, unspecified: Secondary | ICD-10-CM | POA: Diagnosis not present

## 2017-05-27 MED ORDER — BUSPIRONE HCL 10 MG PO TABS
10.00 mg | ORAL_TABLET | ORAL | Status: DC
Start: 2017-05-27 — End: 2017-05-27

## 2017-05-27 MED ORDER — LORAZEPAM 1 MG PO TABS
1.00 mg | ORAL_TABLET | ORAL | Status: DC
Start: ? — End: 2017-05-27

## 2017-05-27 MED ORDER — LACTATED RINGERS IV SOLN
10.00 | INTRAVENOUS | Status: DC
Start: ? — End: 2017-05-27

## 2017-05-27 MED ORDER — NICOTINE POLACRILEX 2 MG MT LOZG
4.00 mg | LOZENGE | OROMUCOSAL | Status: DC
Start: ? — End: 2017-05-27

## 2017-05-28 DIAGNOSIS — F17210 Nicotine dependence, cigarettes, uncomplicated: Secondary | ICD-10-CM | POA: Diagnosis not present

## 2017-05-28 DIAGNOSIS — K8050 Calculus of bile duct without cholangitis or cholecystitis without obstruction: Secondary | ICD-10-CM | POA: Diagnosis not present

## 2017-05-28 DIAGNOSIS — K469 Unspecified abdominal hernia without obstruction or gangrene: Secondary | ICD-10-CM | POA: Diagnosis not present

## 2017-05-28 DIAGNOSIS — K811 Chronic cholecystitis: Secondary | ICD-10-CM | POA: Diagnosis not present

## 2017-05-28 DIAGNOSIS — K8064 Calculus of gallbladder and bile duct with chronic cholecystitis without obstruction: Secondary | ICD-10-CM | POA: Diagnosis not present

## 2017-05-28 DIAGNOSIS — K819 Cholecystitis, unspecified: Secondary | ICD-10-CM | POA: Diagnosis not present

## 2017-05-28 DIAGNOSIS — Z9889 Other specified postprocedural states: Secondary | ICD-10-CM | POA: Diagnosis not present

## 2017-05-28 DIAGNOSIS — F419 Anxiety disorder, unspecified: Secondary | ICD-10-CM | POA: Diagnosis not present

## 2017-05-28 HISTORY — PX: CHOLECYSTECTOMY: SHX55

## 2017-05-29 DIAGNOSIS — K589 Irritable bowel syndrome without diarrhea: Secondary | ICD-10-CM | POA: Diagnosis not present

## 2017-05-29 DIAGNOSIS — K5790 Diverticulosis of intestine, part unspecified, without perforation or abscess without bleeding: Secondary | ICD-10-CM | POA: Diagnosis not present

## 2017-05-29 DIAGNOSIS — K8064 Calculus of gallbladder and bile duct with chronic cholecystitis without obstruction: Secondary | ICD-10-CM | POA: Diagnosis not present

## 2017-05-29 DIAGNOSIS — F419 Anxiety disorder, unspecified: Secondary | ICD-10-CM | POA: Diagnosis not present

## 2017-05-29 DIAGNOSIS — K8050 Calculus of bile duct without cholangitis or cholecystitis without obstruction: Secondary | ICD-10-CM | POA: Diagnosis not present

## 2017-06-21 DIAGNOSIS — Z9049 Acquired absence of other specified parts of digestive tract: Secondary | ICD-10-CM | POA: Diagnosis not present

## 2017-06-23 DIAGNOSIS — E119 Type 2 diabetes mellitus without complications: Secondary | ICD-10-CM | POA: Diagnosis not present

## 2017-06-23 DIAGNOSIS — M545 Low back pain: Secondary | ICD-10-CM | POA: Diagnosis not present

## 2017-06-23 DIAGNOSIS — F419 Anxiety disorder, unspecified: Secondary | ICD-10-CM | POA: Diagnosis not present

## 2017-06-23 DIAGNOSIS — E538 Deficiency of other specified B group vitamins: Secondary | ICD-10-CM | POA: Diagnosis not present

## 2017-06-24 ENCOUNTER — Ambulatory Visit (HOSPITAL_COMMUNITY): Admit: 2017-06-24 | Payer: BLUE CROSS/BLUE SHIELD | Admitting: Gastroenterology

## 2017-06-24 ENCOUNTER — Encounter (HOSPITAL_COMMUNITY): Payer: Self-pay

## 2017-06-24 SURGERY — ENDOSCOPIC RETROGRADE CHOLANGIOPANCREATOGRAPHY (ERCP) WITH PROPOFOL
Anesthesia: Monitor Anesthesia Care

## 2017-07-28 DIAGNOSIS — H52223 Regular astigmatism, bilateral: Secondary | ICD-10-CM | POA: Diagnosis not present

## 2017-09-10 DIAGNOSIS — M545 Low back pain: Secondary | ICD-10-CM | POA: Diagnosis not present

## 2017-09-10 DIAGNOSIS — J029 Acute pharyngitis, unspecified: Secondary | ICD-10-CM | POA: Diagnosis not present

## 2017-09-10 DIAGNOSIS — E559 Vitamin D deficiency, unspecified: Secondary | ICD-10-CM | POA: Diagnosis not present

## 2017-09-10 DIAGNOSIS — E538 Deficiency of other specified B group vitamins: Secondary | ICD-10-CM | POA: Diagnosis not present

## 2017-09-10 DIAGNOSIS — E118 Type 2 diabetes mellitus with unspecified complications: Secondary | ICD-10-CM | POA: Diagnosis not present

## 2017-11-01 DIAGNOSIS — R0989 Other specified symptoms and signs involving the circulatory and respiratory systems: Secondary | ICD-10-CM | POA: Diagnosis not present

## 2017-11-01 DIAGNOSIS — H6121 Impacted cerumen, right ear: Secondary | ICD-10-CM | POA: Diagnosis not present

## 2017-11-01 DIAGNOSIS — R490 Dysphonia: Secondary | ICD-10-CM | POA: Diagnosis not present

## 2017-11-01 DIAGNOSIS — F17200 Nicotine dependence, unspecified, uncomplicated: Secondary | ICD-10-CM | POA: Diagnosis not present

## 2017-12-09 DIAGNOSIS — R110 Nausea: Secondary | ICD-10-CM | POA: Diagnosis not present

## 2017-12-09 DIAGNOSIS — E118 Type 2 diabetes mellitus with unspecified complications: Secondary | ICD-10-CM | POA: Diagnosis not present

## 2017-12-09 DIAGNOSIS — E663 Overweight: Secondary | ICD-10-CM | POA: Diagnosis not present

## 2017-12-09 DIAGNOSIS — E785 Hyperlipidemia, unspecified: Secondary | ICD-10-CM | POA: Diagnosis not present

## 2017-12-09 DIAGNOSIS — M545 Low back pain: Secondary | ICD-10-CM | POA: Diagnosis not present

## 2018-01-27 ENCOUNTER — Telehealth: Payer: Self-pay | Admitting: *Deleted

## 2018-01-27 ENCOUNTER — Encounter (INDEPENDENT_AMBULATORY_CARE_PROVIDER_SITE_OTHER): Payer: Self-pay

## 2018-01-27 ENCOUNTER — Other Ambulatory Visit (INDEPENDENT_AMBULATORY_CARE_PROVIDER_SITE_OTHER): Payer: BLUE CROSS/BLUE SHIELD

## 2018-01-27 ENCOUNTER — Encounter: Payer: Self-pay | Admitting: Physician Assistant

## 2018-01-27 ENCOUNTER — Encounter

## 2018-01-27 ENCOUNTER — Ambulatory Visit: Payer: BLUE CROSS/BLUE SHIELD | Admitting: Physician Assistant

## 2018-01-27 VITALS — BP 124/72 | HR 82 | Ht 67.0 in | Wt 199.0 lb

## 2018-01-27 DIAGNOSIS — R1032 Left lower quadrant pain: Secondary | ICD-10-CM

## 2018-01-27 DIAGNOSIS — R109 Unspecified abdominal pain: Secondary | ICD-10-CM

## 2018-01-27 DIAGNOSIS — K219 Gastro-esophageal reflux disease without esophagitis: Secondary | ICD-10-CM

## 2018-01-27 DIAGNOSIS — Z9049 Acquired absence of other specified parts of digestive tract: Secondary | ICD-10-CM

## 2018-01-27 DIAGNOSIS — R1013 Epigastric pain: Secondary | ICD-10-CM

## 2018-01-27 LAB — CBC WITH DIFFERENTIAL/PLATELET
BASOS ABS: 0 10*3/uL (ref 0.0–0.1)
BASOS PCT: 0.2 % (ref 0.0–3.0)
EOS PCT: 4.4 % (ref 0.0–5.0)
Eosinophils Absolute: 0.4 10*3/uL (ref 0.0–0.7)
HCT: 44.2 % (ref 36.0–46.0)
Hemoglobin: 14.9 g/dL (ref 12.0–15.0)
LYMPHS ABS: 3.6 10*3/uL (ref 0.7–4.0)
Lymphocytes Relative: 38.1 % (ref 12.0–46.0)
MCHC: 33.7 g/dL (ref 30.0–36.0)
MCV: 92.6 fl (ref 78.0–100.0)
MONOS PCT: 5.1 % (ref 3.0–12.0)
Monocytes Absolute: 0.5 10*3/uL (ref 0.1–1.0)
NEUTROS ABS: 4.9 10*3/uL (ref 1.4–7.7)
NEUTROS PCT: 52.2 % (ref 43.0–77.0)
PLATELETS: 223 10*3/uL (ref 150.0–400.0)
RBC: 4.78 Mil/uL (ref 3.87–5.11)
RDW: 12.7 % (ref 11.5–15.5)
WBC: 9.4 10*3/uL (ref 4.0–10.5)

## 2018-01-27 LAB — BASIC METABOLIC PANEL
BUN: 8 mg/dL (ref 6–23)
CALCIUM: 9.5 mg/dL (ref 8.4–10.5)
CO2: 32 meq/L (ref 19–32)
Chloride: 103 mEq/L (ref 96–112)
Creatinine, Ser: 0.79 mg/dL (ref 0.40–1.20)
GFR: 78.82 mL/min (ref >60.00)
GLUCOSE: 118 mg/dL — AB (ref 70–99)
Potassium: 4.3 mEq/L (ref 3.5–5.1)
SODIUM: 140 meq/L (ref 135–145)

## 2018-01-27 MED ORDER — PREDNISONE 10 MG PO TABS: ORAL_TABLET | ORAL | 0 refills | Status: DC

## 2018-01-27 MED ORDER — PANTOPRAZOLE SODIUM 40 MG PO TBEC: DELAYED_RELEASE_TABLET | ORAL | 4 refills | Status: DC

## 2018-01-27 MED ORDER — PREDNISONE 50 MG PO TABS: ORAL_TABLET | ORAL | 0 refills | Status: DC

## 2018-01-27 NOTE — Patient Instructions (Addendum)
Your provider has requested that you go to the basement level for lab work before leaving today. Press "B" on the elevator. The lab is located at the first door on the left as you exit the elevator.  We sent a prescription for protonix 40 mg to your pharmacy/ We also sent scripts for the Prednisone.  You will also need to get Benedryl 25 mg tablets.    On 02-15-18  At 9:00 PM- Take 50 mg of Prednisone.  On 10-2 at 4:00 am take 50 mg of Prednisone.  On 10-2 at 9:00  Am take 50 mg of Predisone On 10-2 also take 50 mg of Benedryl.   After the CT scan: On 10-2 at 6:00 PM- take Prenidsone 50 mg and Benedryl 25 mg.  On 10-3  At 2:00 AM take 30 mg of Prednisone and Benedryl 50 mg.   After the CT scan  We will need to get you scheduled for the colonoscopy.  Normal BMI (Body Mass Index- based on height and weight) is between 19 and 25. Your BMI today is Body mass index is 31.17 kg/m. Marland Kitchen Please consider follow up  regarding your BMI with your Primary Care Provider.   You have been scheduled for a CT scan of the abdomen and pelvis at [facility]..   You are scheduled on 02-16-2018 at 10:00 am You should arrive 15 minutes prior to your appointment time for registration. Please follow the written instructions below on the day of your exam:  WARNING: IF YOU ARE ALLERGIC TO IODINE/X-RAY DYE, PLEASE NOTIFY RADIOLOGY IMMEDIATELY AT [phone]! YOU WILL BE GIVEN A 13 HOUR PREMEDICATION PREP.  1) Do not eat  anything after 6;00 AM (4 hours prior to your test) 2) You have been given 2 bottles of oral contrast to drink. The solution may taste better if refrigerated, but do NOT add ice or any other liquid to this solution. Shake well before drinking.    Drink 1 bottle of contrast @ 8:00 AM (2 hours prior to your exam)  Drink 1 bottle of contrast @ 9:00 AM (1 hour prior to your exam)  You may take any medications as prescribed with a small amount of water except for the following: Metformin,  Glucophage, Glucovance, Avandamet, Riomet, Fortamet, Actoplus Met, Janumet, Glumetza or Metaglip. The above medications must be held the day of the exam AND 48 hours after the exam.  The purpose of you drinking the oral contrast is to aid in the visualization of your intestinal tract. The contrast solution may cause some diarrhea. Before your exam is started, you will be given a small amount of fluid to drink. Depending on your individual set of symptoms, you may also receive an intravenous injection of x-ray contrast/dye. Plan on being at [facility] for 30 minutes or long, depending on the type of exam you are having performed.  If you have any questions regarding your exam or if you need to reschedule, you may call the CT department at [phone] between the hours of 8:00 am and 5:00 pm, Monday-Friday.  ________________________________________________________________________

## 2018-01-27 NOTE — Progress Notes (Addendum)
Subjective:    Patient ID: Kathy Ferguson, female    DOB: 05-25-1955, 62 y.o.   MRN: [id number]  HPI Kathy Ferguson is a pleasant 62 year old white female, known to Dr. Hilarie Fredrickson and myself.  She was last seen here in December 2018 and after that visit had been diagnosed with cholelithiasis and probable choledocholithiasis.  She was initially scheduled for ERCP here and surgical consultation.  She had a vacation plan to [place] she went ahead with, however unfortunately she became ill on her vacation and had to return early.  She went directly from the airport to the [facility] hospital as she had become jaundiced and had 2 attacks of biliary colic.  She underwent ERCP and stone extraction at [facility] Followed by Cholecystectomy.  She says she has done well since, occasionally has some vague epigastric discomfort intermittently.. She has history of GERD and has done well with Protonix 40 mg p.o. daily. Comes in today primarily because of acute onset of left lower quadrant pain which occurred very abruptly when she went from a sitting position to a standing position and was severe enough that her doubled her over.  This occurred about 3 weeks ago and she says it reminded her of previous problems with ruptured ovarian cyst.  She has a history of PCOS, but has gone through menopause and was not sure whether or not she could still get ovarian cysts.  She did not have any change in her bowel habits after onset of the left lower quadrant pain no fever or chills, no rectal bleeding no dysuria.  By the third day her symptoms had improved but she has continued to have some mild soreness in that area. Last colonoscopy was done and 2007 in [place] and was documented to be a normal exam.  She is over due for follow-up colonoscopy.  Review of Systems Pertinent positive and negative review of systems were noted in the above HPI section.  All other review of systems was otherwise negative.  Outpatient Encounter Medications as of 01/27/2018   Medication Sig  . busPIRone (BUSPAR) 15 MG tablet Take 30 mg by mouth 2 (two) times daily.  . cyclobenzaprine (FLEXERIL) 10 MG tablet Take 10 mg by mouth as needed.   . fluconazole (DIFLUCAN) 150 MG tablet Take 1 tablet by mouth daily as needed.  Marland Kitchen HYDROcodone-acetaminophen (NORCO) 10-325 MG tablet Take by mouth. Take 1 tablet by mouth every 4-6 hours daily  . Hyoscyamine Sulfate SL (LEVSIN/SL) 0.125 MG SUBL Dissolve 1 tablet on the tongue every 4-6 hours as needed for acute abdominal pain/spasms.  . nitrofurantoin (MACRODANTIN) 50 MG capsule Take 1 capsule by mouth as needed after intercourse  . ondansetron (ZOFRAN-ODT) 4 MG disintegrating tablet Take 4 mg by mouth daily as needed for nausea or vomiting (uses first for nausea, if doesn't work takes her phenergan).  . pantoprazole (PROTONIX) 40 MG tablet Take 1 tablet by mouth every morning.  Marland Kitchen PROAIR HFA 108 (90 Base) MCG/ACT inhaler inahle 2 puff every 4-6 hours as needed  . promethazine (PHENERGAN) 25 MG tablet Take 0.5 tablets (12.5 mg total) by mouth every 6 (six) hours as needed.  . [DISCONTINUED] pantoprazole (PROTONIX) 40 MG tablet Take 1 tablet by mouth every morning.  . predniSONE (DELTASONE) 10 MG tablet Take as directed for CT premedication.  . predniSONE (DELTASONE) 50 MG tablet Take as directed for Ct scan pre medication.  . [DISCONTINUED] ciprofloxacin (CIPRO) 500 MG tablet Take 1 tablet by mouth 2 (two) times daily as  needed (for diarrhea or UTI).   . [DISCONTINUED] ciprofloxacin (CIPRO) 500 MG tablet Take 1 tablet (500 mg total) by mouth 2 (two) times daily.   No facility-administered encounter medications on file as of 01/27/2018.    Allergies  Allergen Reactions  . Iohexol      Code: HIVES, Desc: pt has hives/swelling with iv cm, Onset Date: 32992426   . Latex    Patient Active Problem List   Diagnosis Date Noted  . STRICTURE AND STENOSIS OF ESOPHAGUS 08/15/2008  . ABDOMINAL WALL PAIN 06/20/2008  . Diverticulitis of  colon (without mention of hemorrhage)(562.11) 05/15/2008  . Abdominal pain, left lower quadrant 05/15/2008   Social History   Socioeconomic History  . Marital status: Married    Spouse name: Not on file  . Number of children: 2  . Years of education: Not on file  . Highest education level: Not on file  Occupational History  . Occupation: Hotel manager  Social Needs  . Financial resource strain: Not on file  . Food insecurity:    Worry: Not on file    Inability: Not on file  . Transportation needs:    Medical: Not on file    Non-medical: Not on file  Tobacco Use  . Smoking status: Current Every Day Smoker    Packs/day: 1.50    Types: Cigarettes  . Smokeless tobacco: Never Used  . Tobacco comment: tobacco info given 01/27/18  Substance and Sexual Activity  . Alcohol use: Yes    Comment: occasional  . Drug use: No  . Sexual activity: Not on file  Lifestyle  . Physical activity:    Days per week: Not on file    Minutes per session: Not on file  . Stress: Not on file  Relationships  . Social connections:    Talks on phone: Not on file    Gets together: Not on file    Attends religious service: Not on file    Active member of club or organization: Not on file    Attends meetings of clubs or organizations: Not on file    Relationship status: Not on file  . Intimate partner violence:    Fear of current or ex partner: Not on file    Emotionally abused: Not on file    Physically abused: Not on file    Forced sexual activity: Not on file  Other Topics Concern  . Not on file  Social History Narrative  . Not on file    Ms. Diesel's family history includes Breast cancer in her mother; Colon polyps in her mother; Diabetes in her brother, father, maternal grandmother, other, paternal aunt, paternal aunt, paternal uncle, and paternal uncle; Stroke in her father, maternal grandmother, other, paternal aunt, paternal aunt, paternal uncle, and paternal uncle.        Objective:    Vitals:   01/27/18 1123  BP: 124/72  Pulse: 82    Physical Exam; well-developed white female in no acute distress, pleasant blood pressure 124/72 pulse 82, height 5 foot 7, weight 199, BMI 31.1.  HEENT; nontraumatic normocephalic EOMI PERRLA sclera anicteric oral mucosa moist, Cardiovascular; regular rate; and rhythm with S1-S2 no murmur gallop, Pulmonary; clear bilaterally, Abdomen soft, there is some tenderness in the left lower quadrant suprapubic area no guarding or rebound.  She also has some tenderness across the upper abdomen.  No palpable mass or hepatosplenomegaly, no appreciable hernia.  Rectal ;exam not done, Ext;no clubbing cyanosis or edema skin warm dry, Neuro  psych; alert and oriented, grossly nonfocal mood and affect appropriate       Assessment & Plan:   #31 62 year old white female with 3-week history of acute left lower quadrant/suprapubic abdominal pain very abrupt onset with significant pain for the first 2 days and now more of a soreness. Patient has history of previous ovarian cyst and history of PCOS.  Her symptoms are very consistent with ruptured or hemorrhagic ovarian cyst.  She has not had previously documented diverticular disease, and no evidence for hernia on exam.  #2 colon cancer screening-overdue for follow-up colonoscopy last done at [facility] 2007 and normal #3 GERD stable  #4.  Remittent upper abdominal discomfort etiology not clear-patient is status post ERCP and stone extraction for choledocholithiasis and cholecystectomy January 2019 [facility]  #5 hypertension  Plan; We will schedule for CT of the abdomen and pelvis with contrast. Patient does have contrast allergy and has tolerated contrast with premedication with prednisone and Benadryl in the past.  She will be given a premed protocol, and will also extend this out for 24 hours after contrast. Continue Protonix 40 mg every morning if in refill x1 year CBC and BMET today  Will await CT  of the abdomen and pelvis, and plan to schedule for colonoscopy with Dr. Hilarie Fredrickson after CT has been reviewed.  Abbegail Matuska Genia Harold PA-C 01/27/2018   Cc: Ocie Doyne., MD   Addendum: Reviewed and agree with initial management and plan Pyrtle, Lajuan Lines, MD

## 2018-01-27 NOTE — Telephone Encounter (Signed)
Per Nicoletta Ba PA, called the patient and gave her the information for [facility], [address], [place], [place].  Phone is [phone]. Earnstine Regal NP.

## 2018-02-02 DIAGNOSIS — H25813 Combined forms of age-related cataract, bilateral: Secondary | ICD-10-CM | POA: Diagnosis not present

## 2018-02-16 ENCOUNTER — Ambulatory Visit (HOSPITAL_COMMUNITY)
Admission: RE | Admit: 2018-02-16 | Discharge: 2018-02-16 | Disposition: A | Discharge status: Home / Self Care | Payer: BLUE CROSS/BLUE SHIELD | Source: Ambulatory Visit | Attending: Physician Assistant | Admitting: Physician Assistant

## 2018-02-16 ENCOUNTER — Encounter (HOSPITAL_COMMUNITY): Payer: Self-pay

## 2018-02-16 ENCOUNTER — Other Ambulatory Visit: Payer: Self-pay | Admitting: Physician Assistant

## 2018-02-16 DIAGNOSIS — R109 Unspecified abdominal pain: Secondary | ICD-10-CM

## 2018-02-16 DIAGNOSIS — R1032 Left lower quadrant pain: Secondary | ICD-10-CM | POA: Diagnosis not present

## 2018-02-16 DIAGNOSIS — Z9049 Acquired absence of other specified parts of digestive tract: Secondary | ICD-10-CM | POA: Diagnosis not present

## 2018-02-16 DIAGNOSIS — K5730 Diverticulosis of large intestine without perforation or abscess without bleeding: Secondary | ICD-10-CM | POA: Diagnosis not present

## 2018-02-17 ENCOUNTER — Telehealth: Payer: Self-pay | Admitting: Physician Assistant

## 2018-02-18 NOTE — Telephone Encounter (Signed)
Patient advised the CT has not been reviewed.

## 2018-03-02 DIAGNOSIS — L820 Inflamed seborrheic keratosis: Secondary | ICD-10-CM | POA: Diagnosis not present

## 2018-03-02 DIAGNOSIS — D485 Neoplasm of uncertain behavior of skin: Secondary | ICD-10-CM | POA: Diagnosis not present

## 2018-03-02 DIAGNOSIS — H25813 Combined forms of age-related cataract, bilateral: Secondary | ICD-10-CM | POA: Diagnosis not present

## 2018-03-02 DIAGNOSIS — D2239 Melanocytic nevi of other parts of face: Secondary | ICD-10-CM | POA: Diagnosis not present

## 2018-03-02 DIAGNOSIS — C44311 Basal cell carcinoma of skin of nose: Secondary | ICD-10-CM | POA: Diagnosis not present

## 2018-03-02 DIAGNOSIS — D1801 Hemangioma of skin and subcutaneous tissue: Secondary | ICD-10-CM | POA: Diagnosis not present

## 2018-03-02 DIAGNOSIS — D225 Melanocytic nevi of trunk: Secondary | ICD-10-CM | POA: Diagnosis not present

## 2018-03-02 DIAGNOSIS — L814 Other melanin hyperpigmentation: Secondary | ICD-10-CM | POA: Diagnosis not present

## 2018-03-03 DIAGNOSIS — R102 Pelvic and perineal pain: Secondary | ICD-10-CM | POA: Diagnosis not present

## 2018-03-03 DIAGNOSIS — E559 Vitamin D deficiency, unspecified: Secondary | ICD-10-CM | POA: Diagnosis not present

## 2018-03-03 DIAGNOSIS — M545 Low back pain: Secondary | ICD-10-CM | POA: Diagnosis not present

## 2018-03-03 DIAGNOSIS — E118 Type 2 diabetes mellitus with unspecified complications: Secondary | ICD-10-CM | POA: Diagnosis not present

## 2018-03-16 ENCOUNTER — Encounter: Payer: Self-pay | Admitting: Internal Medicine

## 2018-03-16 ENCOUNTER — Ambulatory Visit (AMBULATORY_SURGERY_CENTER): Payer: Self-pay | Admitting: *Deleted

## 2018-03-16 VITALS — Ht 67.0 in | Wt 205.0 lb

## 2018-03-16 DIAGNOSIS — Z1211 Encounter for screening for malignant neoplasm of colon: Secondary | ICD-10-CM

## 2018-03-16 DIAGNOSIS — K219 Gastro-esophageal reflux disease without esophagitis: Secondary | ICD-10-CM

## 2018-03-16 DIAGNOSIS — R1310 Dysphagia, unspecified: Secondary | ICD-10-CM

## 2018-03-16 MED ORDER — NA SULFATE-K SULFATE-MG SULF 17.5-3.13-1.6 GM/177ML PO SOLN: 1.0000 | Freq: Once | ORAL | 0 refills | Status: AC

## 2018-03-16 NOTE — Progress Notes (Signed)
Pt states she has been having funny feelings in her face and mouth- face tingles and mouth feels "weird"- face feels tight- + smoker- her pain meds do help with this feeling   No egg or soy allergy known to patient  No issues with past sedation with any surgeries  or procedures, no intubation problems  No diet pills per patient No home 02 use per patient  No blood thinners per patient  Pt denies issues with constipation  No A fib or A flutter  EMMI video sent to pt's e mail both colon and EGD  Suprep $15 coupon to pt in [place] today

## 2018-03-23 ENCOUNTER — Encounter: Payer: BLUE CROSS/BLUE SHIELD | Admitting: Internal Medicine

## 2018-03-23 ENCOUNTER — Encounter: Payer: Self-pay | Admitting: Internal Medicine

## 2018-03-23 ENCOUNTER — Ambulatory Visit (AMBULATORY_SURGERY_CENTER): Payer: BLUE CROSS/BLUE SHIELD | Admitting: Internal Medicine

## 2018-03-23 VITALS — BP 138/78 | HR 83 | Temp 98.9°F | Resp 18 | Ht 67.0 in | Wt 205.0 lb

## 2018-03-23 DIAGNOSIS — D123 Benign neoplasm of transverse colon: Secondary | ICD-10-CM | POA: Diagnosis not present

## 2018-03-23 DIAGNOSIS — K2100 Gastro-esophageal reflux disease with esophagitis, without bleeding: Secondary | ICD-10-CM

## 2018-03-23 DIAGNOSIS — K449 Diaphragmatic hernia without obstruction or gangrene: Secondary | ICD-10-CM

## 2018-03-23 DIAGNOSIS — D124 Benign neoplasm of descending colon: Secondary | ICD-10-CM | POA: Diagnosis not present

## 2018-03-23 DIAGNOSIS — Z1211 Encounter for screening for malignant neoplasm of colon: Secondary | ICD-10-CM | POA: Diagnosis not present

## 2018-03-23 DIAGNOSIS — K222 Esophageal obstruction: Secondary | ICD-10-CM | POA: Diagnosis not present

## 2018-03-23 DIAGNOSIS — R1310 Dysphagia, unspecified: Secondary | ICD-10-CM

## 2018-03-23 MED ORDER — SODIUM CHLORIDE 0.9 % IV SOLN: 500.0000 mL | Freq: Once | INTRAVENOUS | Status: AC

## 2018-03-23 NOTE — Progress Notes (Signed)
A and O x3. Report to RN. Tolerated MAC anesthesia well.Teeth unchanged after procedure.

## 2018-03-23 NOTE — Op Note (Signed)
[place] Patient Name: Kathy Ferguson Procedure Date: 03/23/2018 3:46 PM MRN: [id number] Endoscopist: Jerene Bears , MD Age: 62 Referring MD:  Date of Birth: 23-Jul-1955 Gender: Female Account #: [id number] Procedure:                Colonoscopy Indications:              Screening for colorectal malignant neoplasm, Last                            colonoscopy: 2007 Medicines:                Monitored Anesthesia Care Procedure:                Pre-Anesthesia Assessment:                           - Prior to the procedure, a History and Physical                            was performed, and patient medications and                            allergies were reviewed. The patient's tolerance of                            previous anesthesia was also reviewed. The risks                            and benefits of the procedure and the sedation                            options and risks were discussed with the patient.                            All questions were answered, and informed consent                            was obtained. Prior Anticoagulants: The patient has                            taken no previous anticoagulant or antiplatelet                            agents. ASA Grade Assessment: II - A patient with                            mild systemic disease. After reviewing the risks                            and benefits, the patient was deemed in                            satisfactory condition to undergo the procedure.  After obtaining informed consent, the colonoscope                            was passed under direct vision. Throughout the                            procedure, the patient's blood pressure, pulse, and                            oxygen saturations were monitored continuously. The                            Colonoscope was introduced through the anus and                            advanced to the cecum, identified by  appendiceal                            orifice and ileocecal valve. The colonoscopy was                            performed without difficulty. The patient tolerated                            the procedure well. The quality of the bowel                            preparation was good. The ileocecal valve,                            appendiceal orifice, and rectum were photographed. Scope In: 4:02:01 PM Scope Out: 4:15:17 PM Scope Withdrawal Time: 0 hours 9 minutes 42 seconds  Total Procedure Duration: 0 hours 13 minutes 16 seconds  Findings:                 The digital rectal exam was normal.                           A 5 mm polyp was found in the hepatic flexure. The                            polyp was sessile. The polyp was removed with a                            cold snare. Resection and retrieval were complete.                           A 5 mm polyp was found in the descending colon. The                            polyp was sessile. The polyp was removed with a  cold snare. Resection and retrieval were complete.                           Multiple small-mouthed diverticula were found in                            the sigmoid colon.                           External and internal hemorrhoids were found during                            retroflexion. The hemorrhoids were medium-sized. Complications:            No immediate complications. Estimated Blood Loss:     Estimated blood loss was minimal. Impression:               - One 5 mm polyp at the hepatic flexure, removed                            with a cold snare. Resected and retrieved.                           - One 5 mm polyp in the descending colon, removed                            with a cold snare. Resected and retrieved.                           - Diverticulosis in the sigmoid colon.                           - External and internal hemorrhoids. Recommendation:           - Patient has a contact  number available for                            emergencies. The signs and symptoms of potential                            delayed complications were discussed with the                            patient. Return to normal activities tomorrow.                            Written discharge instructions were provided to the                            patient.                           - Resume previous diet.                           - Continue present medications.                           -  Await pathology results.                           - Repeat colonoscopy is recommended for                            surveillance. The colonoscopy date will be                            determined after pathology results from today's                            exam become available for review. Jerene Bears, MD 03/23/2018 4:25:37 PM This report has been signed electronically.

## 2018-03-23 NOTE — Patient Instructions (Signed)
YOU HAD AN ENDOSCOPIC PROCEDURE TODAY AT [facility]:   Refer to the procedure report that was given to you for any specific questions about what was found during the examination.  If the procedure report does not answer your questions, please call your gastroenterologist to clarify.  If you requested that your care partner not be given the details of your procedure findings, then the procedure report has been included in a sealed envelope for you to review at your convenience later.  YOU SHOULD EXPECT: Some feelings of bloating in the abdomen. Passage of more gas than usual.  Walking can help get rid of the air that was put into your GI tract during the procedure and reduce the bloating. If you had a lower endoscopy (such as a colonoscopy or flexible sigmoidoscopy) you may notice spotting of blood in your stool or on the toilet paper. If you underwent a bowel prep for your procedure, you may not have a normal bowel movement for a few days.  Please Note:  You might notice some irritation and congestion in your nose or some drainage.  This is from the oxygen used during your procedure.  There is no need for concern and it should clear up in a day or so.  SYMPTOMS TO REPORT IMMEDIATELY:   Following lower endoscopy (colonoscopy or flexible sigmoidoscopy):  Excessive amounts of blood in the stool  Significant tenderness or worsening of abdominal pains  Swelling of the abdomen that is new, acute  Fever of 100F or higher   Following upper endoscopy (EGD)  Vomiting of blood or coffee ground material  New chest pain or pain under the shoulder blades  Painful or persistently difficult swallowing  New shortness of breath  Fever of 100F or higher  Black, tarry-looking stools  For urgent or emergent issues, a gastroenterologist can be reached at any hour by calling [phone].   DIET: Follow Post Esophageal Dilation Diet.  Drink plenty of fluids but you should avoid alcoholic  beverages for 24 hours.  ACTIVITY:  You should plan to take it easy for the rest of today and you should NOT DRIVE or use heavy machinery until tomorrow (because of the sedation medicines used during the test).    FOLLOW UP: Our staff will call the number listed on your records the next business day following your procedure to check on you and address any questions or concerns that you may have regarding the information given to you following your procedure. If we do not reach you, we will leave a message.  However, if you are feeling well and you are not experiencing any problems, there is no need to return our call.  We will assume that you have returned to your regular daily activities without incident.  If any biopsies were taken you will be contacted by phone or by letter within the next 1-3 weeks.  Please call us at [phone] if you have not heard about the biopsies in 3 weeks.   Await for biopsy results Post esophageal Dilation Diet (handout given) Esophagitis and Stricture (handout given) Polyps (handout given) Hemorrhoids (handout given) Diverticulosis (handout given)    SIGNATURES/CONFIDENTIALITY: You and/or your care partner have signed paperwork which will be entered into your electronic medical record.  These signatures attest to the fact that that the information above on your After Visit Summary has been reviewed and is understood.  Full responsibility of the confidentiality of this discharge information lies with you and/or your  care-partner.

## 2018-03-23 NOTE — Op Note (Signed)
[place] Patient Name: Kathy Ferguson Procedure Date: 03/23/2018 3:47 PM MRN: [id number] Endoscopist: Jerene Bears , MD Age: 62 Referring MD:  Date of Birth: 1955/10/03 Gender: Female Account #: [id number] Procedure:                Upper GI endoscopy Indications:              Dysphagia, Gastro-esophageal reflux disease Medicines:                Monitored Anesthesia Care Procedure:                Pre-Anesthesia Assessment:                           - Prior to the procedure, a History and Physical                            was performed, and patient medications and                            allergies were reviewed. The patient's tolerance of                            previous anesthesia was also reviewed. The risks                            and benefits of the procedure and the sedation                            options and risks were discussed with the patient.                            All questions were answered, and informed consent                            was obtained. Prior Anticoagulants: The patient has                            taken no previous anticoagulant or antiplatelet                            agents. ASA Grade Assessment: II - A patient with                            mild systemic disease. After reviewing the risks                            and benefits, the patient was deemed in                            satisfactory condition to undergo the procedure.                           After obtaining informed consent, the endoscope was  passed under direct vision. Throughout the                            procedure, the patient's blood pressure, pulse, and                            oxygen saturations were monitored continuously. The                            Model GIF-[id number]) scope was introduced                            through the mouth, and advanced to the second part                            of duodenum.  The upper GI endoscopy was                            accomplished without difficulty. The patient                            tolerated the procedure well. Scope In: Scope Out: Findings:                 A 3 cm hiatal hernia was present.                           A low-grade of narrowing Schatzki ring was found at                            the gastroesophageal junction. A TTS dilator was                            passed through the scope. Dilation with a 16-17-18                            mm balloon dilator was performed to 18 mm.                           The entire examined stomach was normal.                           The examined duodenum was normal. Complications:            No immediate complications. Estimated Blood Loss:     Estimated blood loss was minimal. Impression:               - 3 cm hiatal hernia.                           - Low-grade of narrowing Schatzki ring. Dilated to                            18 mm.                           -  Normal stomach.                           - Normal examined duodenum.                           - No specimens collected. Recommendation:           - Patient has a contact number available for                            emergencies. The signs and symptoms of potential                            delayed complications were discussed with the                            patient. Return to normal activities tomorrow.                            Written discharge instructions were provided to the                            patient.                           - Resume previous diet.                           - Continue present medications.                           - Repeat upper endoscopy PRN for retreatment. Jerene Bears, MD 03/23/2018 4:21:00 PM This report has been signed electronically.

## 2018-03-23 NOTE — Progress Notes (Signed)
Pt's states no medical or surgical changes since previsit or office visit. 

## 2018-03-24 ENCOUNTER — Telehealth: Payer: Self-pay

## 2018-03-24 NOTE — Telephone Encounter (Signed)
  Follow up Call-  Call back number 03/23/2018  Post procedure Call Back phone  # [phone]  Permission to leave phone message Yes  Some recent data might be hidden     Patient questions:  Do you have a fever, pain , or abdominal swelling? No. Pain Score  0 *  Have you tolerated food without any problems? Yes.    Have you been able to return to your normal activities? Yes.    Do you have any questions about your discharge instructions: Diet   No. Medications  No. Follow up visit  No.  Do you have questions or concerns about your Care? No.  Actions: * If pain score is 4 or above: No action needed, pain <4.

## 2018-03-30 ENCOUNTER — Encounter: Payer: Self-pay | Admitting: Internal Medicine

## 2018-05-03 DIAGNOSIS — N390 Urinary tract infection, site not specified: Secondary | ICD-10-CM | POA: Diagnosis not present

## 2018-05-03 DIAGNOSIS — F419 Anxiety disorder, unspecified: Secondary | ICD-10-CM | POA: Diagnosis not present

## 2018-05-03 DIAGNOSIS — E118 Type 2 diabetes mellitus with unspecified complications: Secondary | ICD-10-CM | POA: Diagnosis not present

## 2018-05-03 DIAGNOSIS — R102 Pelvic and perineal pain: Secondary | ICD-10-CM | POA: Diagnosis not present

## 2018-05-03 DIAGNOSIS — M545 Low back pain: Secondary | ICD-10-CM | POA: Diagnosis not present

## 2018-07-07 DIAGNOSIS — L820 Inflamed seborrheic keratosis: Secondary | ICD-10-CM | POA: Diagnosis not present

## 2018-08-22 DIAGNOSIS — J9801 Acute bronchospasm: Secondary | ICD-10-CM | POA: Diagnosis not present

## 2018-08-22 DIAGNOSIS — E119 Type 2 diabetes mellitus without complications: Secondary | ICD-10-CM | POA: Diagnosis not present

## 2018-08-22 DIAGNOSIS — K219 Gastro-esophageal reflux disease without esophagitis: Secondary | ICD-10-CM | POA: Diagnosis not present

## 2018-08-22 DIAGNOSIS — M545 Low back pain: Secondary | ICD-10-CM | POA: Diagnosis not present

## 2018-09-20 DIAGNOSIS — R102 Pelvic and perineal pain: Secondary | ICD-10-CM | POA: Diagnosis not present

## 2018-09-20 DIAGNOSIS — N390 Urinary tract infection, site not specified: Secondary | ICD-10-CM | POA: Diagnosis not present

## 2018-09-20 DIAGNOSIS — E559 Vitamin D deficiency, unspecified: Secondary | ICD-10-CM | POA: Diagnosis not present

## 2018-09-20 DIAGNOSIS — M545 Low back pain: Secondary | ICD-10-CM | POA: Diagnosis not present

## 2018-09-29 IMAGING — US US ABDOMEN COMPLETE
1 series · 13 of 25 positions shown · non-contrast
Comparison: CT Abdomen and Pelvis 05/16/2008.

CLINICAL DATA: 61-year-old female with right upper quadrant
abdominal pain, nausea, early satiety.

EXAM:
ABDOMEN ULTRASOUND COMPLETE

[Series 1: us abdomen complete · 0.25mm/px · 13 of 112 slices shown]
[im 1/112]
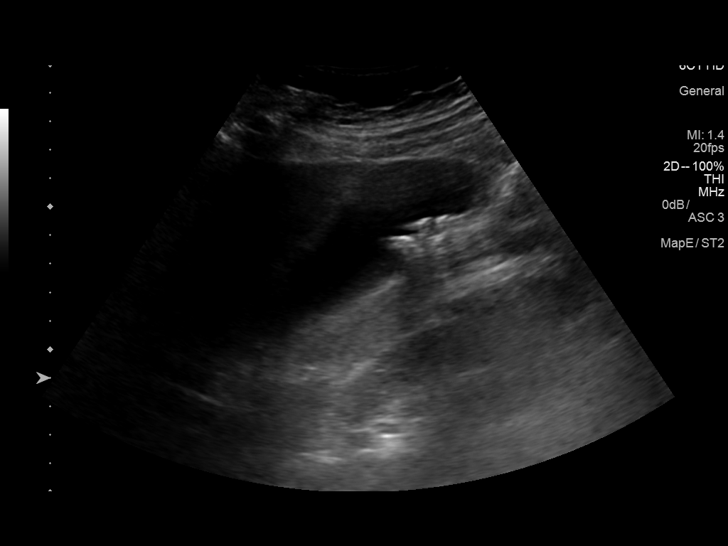
[im 10/112]
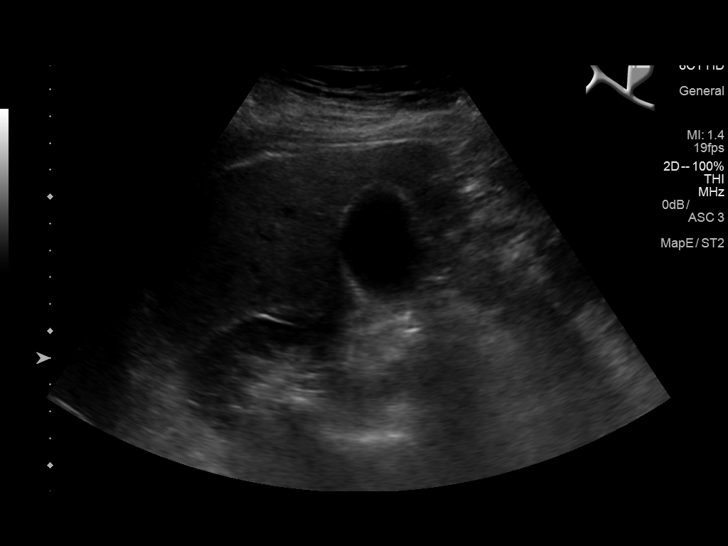
[im 19/112]
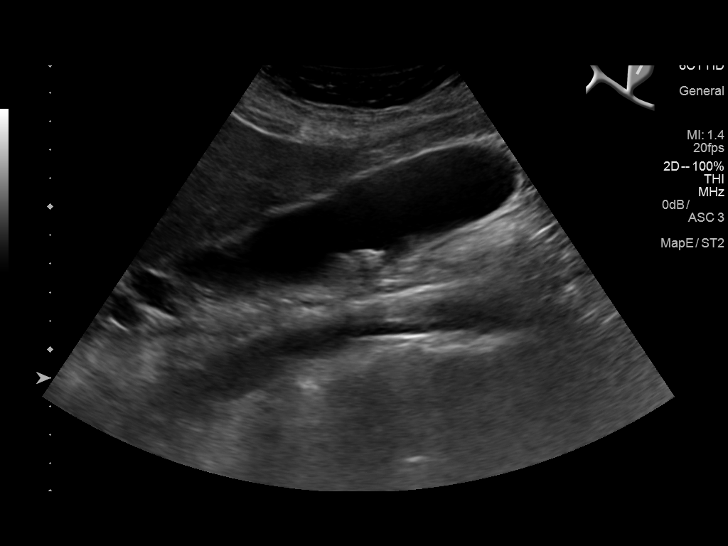
[im 28/112]
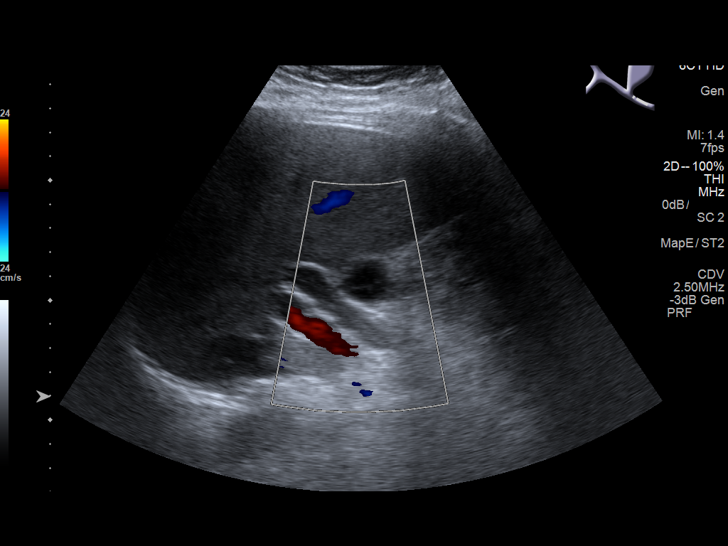
[im 38/112]
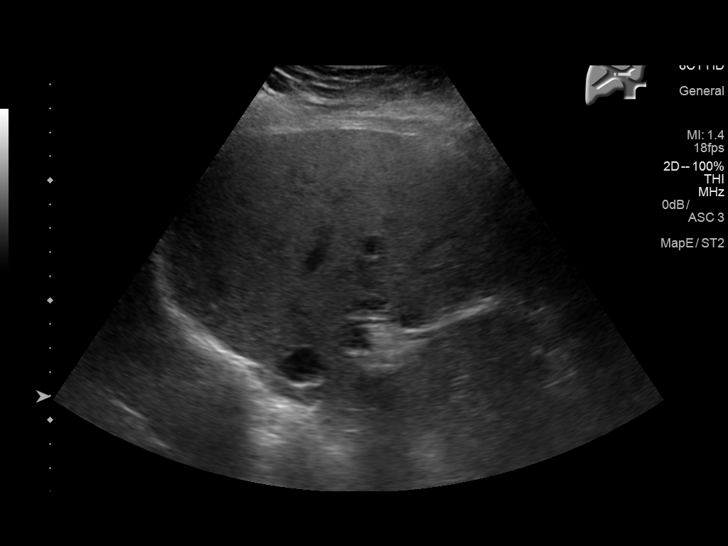
[im 47/112]
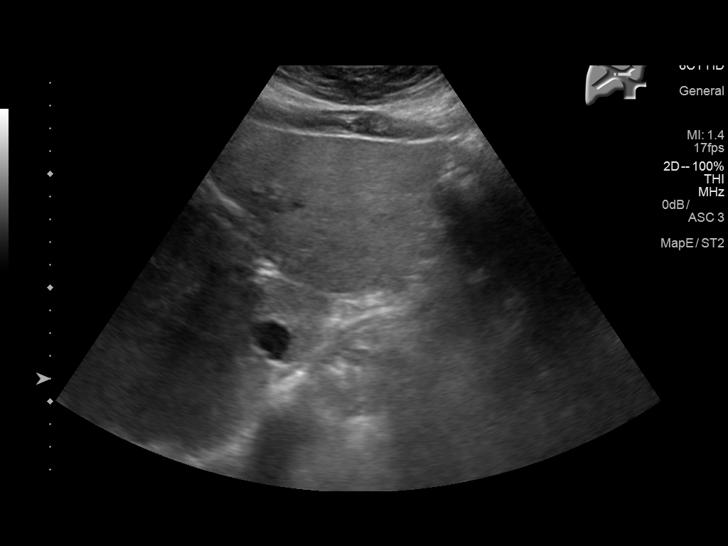
[im 56/112]
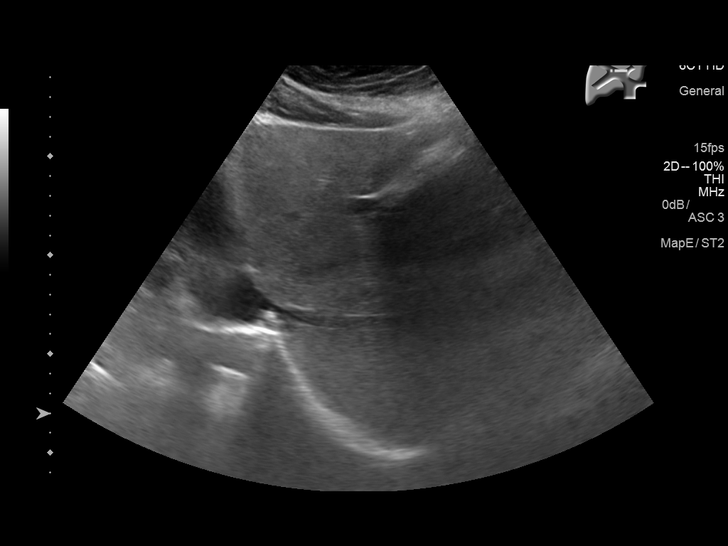
[im 65/112]
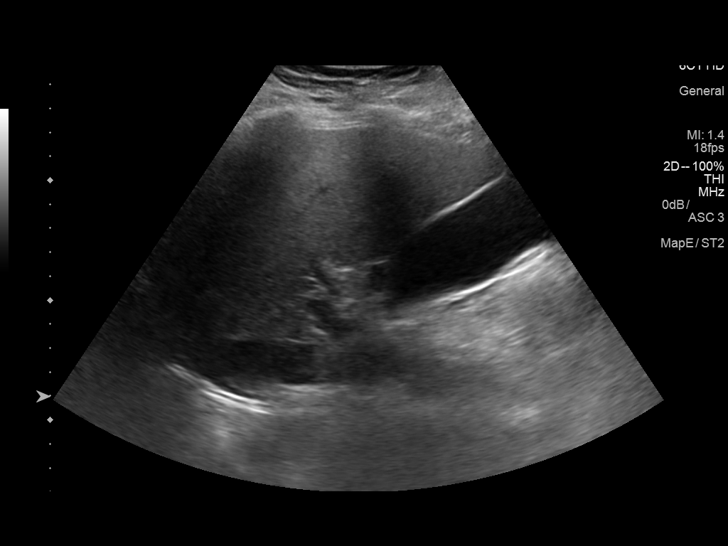
[im 75/112]
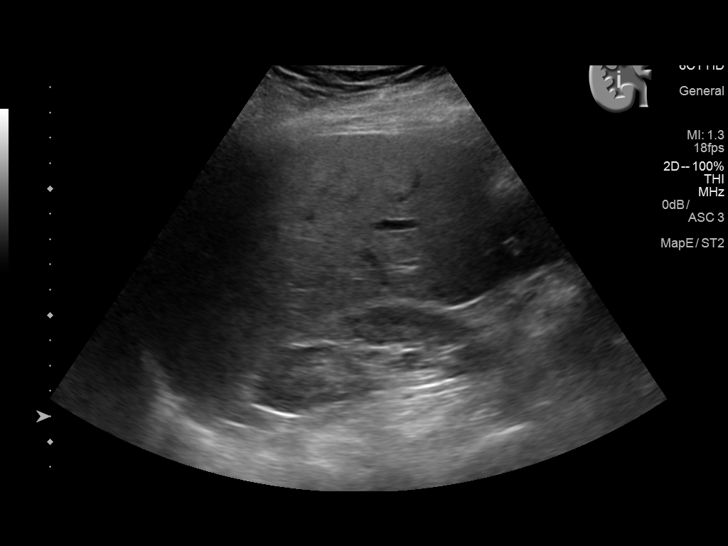
[im 84/112]
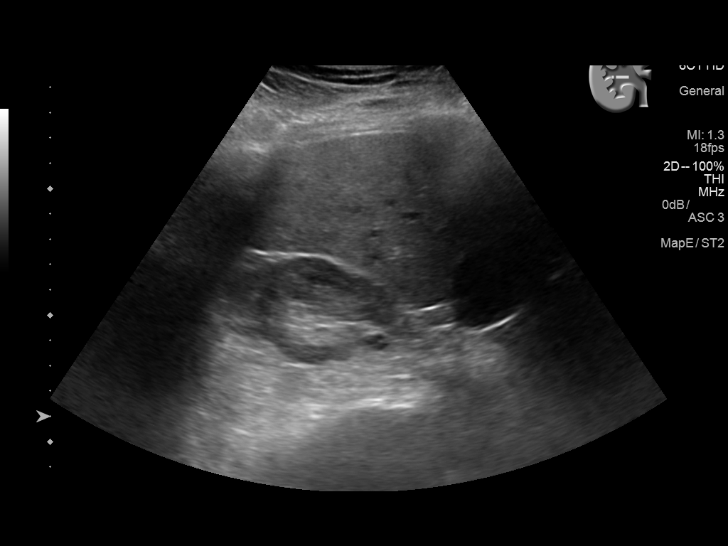
[im 93/112]
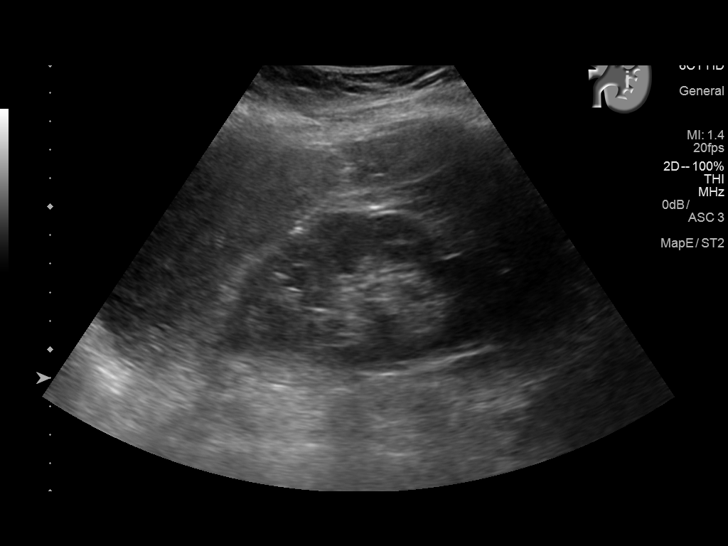
[im 102/112]
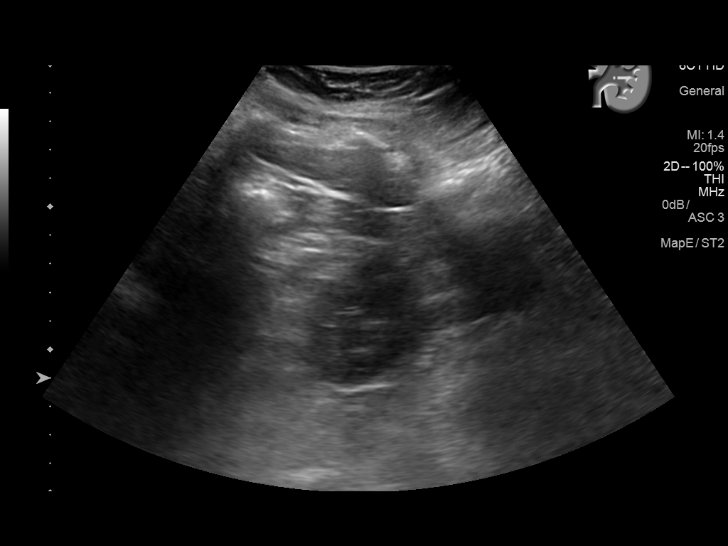
[im 112/112]
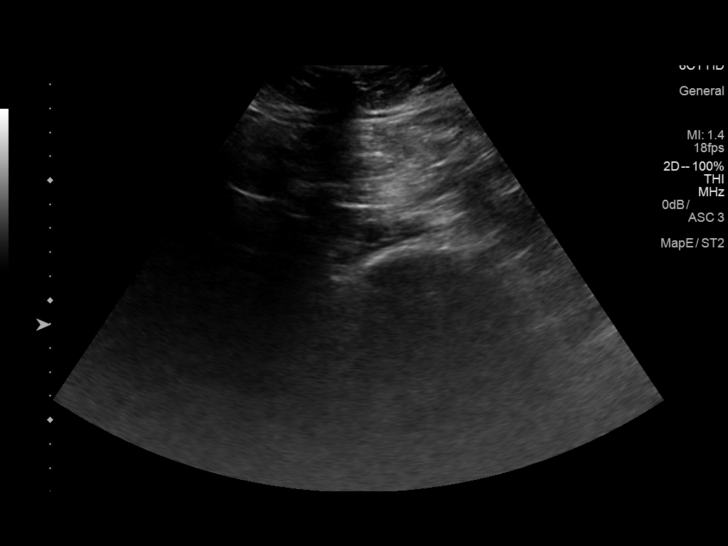

[13 of 25 positions shown; findings below may reference images not displayed]

FINDINGS: Gallbladder: Multiple echogenic, shadowing gallstones. These are
individually estimated at 10 mm diameter (image 4). No gallbladder
wall thickening or pericholecystic fluid. No sonographic Murphy sign
elicited.

Common bile duct: Diameter: Abnormally dilated up to 10 mm (image
30). The visible course of the duct is dilated. Suspicion of
echogenic stone(s) in the distal duct on image 35.

Liver: No intrahepatic biliary ductal dilatation is evident. Liver
echogenicity is within normal limits. No discrete liver lesion
identified. Portal vein is patent on color Doppler imaging with
normal direction of blood flow towards the liver.

IVC: No abnormality visualized.

Pancreas: Visualized portion unremarkable.

Spleen: Size and appearance within normal limits.

Right Kidney: Length: 10.7 cm. Echogenicity within normal limits. No
mass or hydronephrosis visualized.

Left Kidney: Length: 11.1 cm. Echogenicity within normal limits. No
mass or hydronephrosis visualized.

Abdominal aorta: No aneurysm visualized.

Other findings: None.
IMPRESSION: 1. Dilated common bile duct with evidence of Choledocholithiasis.
Recommend ERCP.
2. Cholelithiasis but no evidence of acute cholecystitis.
3. Otherwise negative abdomen ultrasound.
4. These results will be called to the ordering clinician or
representative by the Radiologist Assistant, and communication
documented in the PACS or zVision Dashboard.

## 2018-10-20 DIAGNOSIS — K219 Gastro-esophageal reflux disease without esophagitis: Secondary | ICD-10-CM | POA: Diagnosis not present

## 2018-10-20 DIAGNOSIS — E118 Type 2 diabetes mellitus with unspecified complications: Secondary | ICD-10-CM | POA: Diagnosis not present

## 2018-10-20 DIAGNOSIS — R110 Nausea: Secondary | ICD-10-CM | POA: Diagnosis not present

## 2018-10-20 DIAGNOSIS — M545 Low back pain: Secondary | ICD-10-CM | POA: Diagnosis not present

## 2018-11-17 DIAGNOSIS — N390 Urinary tract infection, site not specified: Secondary | ICD-10-CM | POA: Diagnosis not present

## 2018-11-17 DIAGNOSIS — K219 Gastro-esophageal reflux disease without esophagitis: Secondary | ICD-10-CM | POA: Diagnosis not present

## 2018-11-17 DIAGNOSIS — M545 Low back pain: Secondary | ICD-10-CM | POA: Diagnosis not present

## 2018-11-17 DIAGNOSIS — E118 Type 2 diabetes mellitus with unspecified complications: Secondary | ICD-10-CM | POA: Diagnosis not present

## 2018-12-15 DIAGNOSIS — E785 Hyperlipidemia, unspecified: Secondary | ICD-10-CM | POA: Diagnosis not present

## 2018-12-15 DIAGNOSIS — K219 Gastro-esophageal reflux disease without esophagitis: Secondary | ICD-10-CM | POA: Diagnosis not present

## 2018-12-15 DIAGNOSIS — E118 Type 2 diabetes mellitus with unspecified complications: Secondary | ICD-10-CM | POA: Diagnosis not present

## 2018-12-15 DIAGNOSIS — E559 Vitamin D deficiency, unspecified: Secondary | ICD-10-CM | POA: Diagnosis not present

## 2018-12-15 DIAGNOSIS — M545 Low back pain: Secondary | ICD-10-CM | POA: Diagnosis not present

## 2018-12-15 DIAGNOSIS — L570 Actinic keratosis: Secondary | ICD-10-CM | POA: Diagnosis not present

## 2018-12-15 DIAGNOSIS — Z6832 Body mass index (BMI) 32.0-32.9, adult: Secondary | ICD-10-CM | POA: Diagnosis not present

## 2019-01-11 DIAGNOSIS — E118 Type 2 diabetes mellitus with unspecified complications: Secondary | ICD-10-CM | POA: Diagnosis not present

## 2019-01-11 DIAGNOSIS — F419 Anxiety disorder, unspecified: Secondary | ICD-10-CM | POA: Diagnosis not present

## 2019-01-11 DIAGNOSIS — M545 Low back pain: Secondary | ICD-10-CM | POA: Diagnosis not present

## 2019-01-11 DIAGNOSIS — N390 Urinary tract infection, site not specified: Secondary | ICD-10-CM | POA: Diagnosis not present

## 2019-02-08 DIAGNOSIS — J9801 Acute bronchospasm: Secondary | ICD-10-CM | POA: Diagnosis not present

## 2019-02-08 DIAGNOSIS — G8929 Other chronic pain: Secondary | ICD-10-CM | POA: Diagnosis not present

## 2019-02-08 DIAGNOSIS — M545 Low back pain: Secondary | ICD-10-CM | POA: Diagnosis not present

## 2019-02-08 DIAGNOSIS — R102 Pelvic and perineal pain: Secondary | ICD-10-CM | POA: Diagnosis not present

## 2019-02-28 DIAGNOSIS — R519 Headache, unspecified: Secondary | ICD-10-CM | POA: Diagnosis not present

## 2019-02-28 DIAGNOSIS — Z20828 Contact with and (suspected) exposure to other viral communicable diseases: Secondary | ICD-10-CM | POA: Diagnosis not present

## 2019-03-06 DIAGNOSIS — M545 Low back pain: Secondary | ICD-10-CM | POA: Diagnosis not present

## 2019-03-06 DIAGNOSIS — R110 Nausea: Secondary | ICD-10-CM | POA: Diagnosis not present

## 2019-03-06 DIAGNOSIS — E559 Vitamin D deficiency, unspecified: Secondary | ICD-10-CM | POA: Diagnosis not present

## 2019-03-06 DIAGNOSIS — N390 Urinary tract infection, site not specified: Secondary | ICD-10-CM | POA: Diagnosis not present

## 2019-03-07 DIAGNOSIS — Z20828 Contact with and (suspected) exposure to other viral communicable diseases: Secondary | ICD-10-CM | POA: Diagnosis not present

## 2019-03-07 DIAGNOSIS — J069 Acute upper respiratory infection, unspecified: Secondary | ICD-10-CM | POA: Diagnosis not present

## 2019-03-07 DIAGNOSIS — B349 Viral infection, unspecified: Secondary | ICD-10-CM | POA: Diagnosis not present

## 2019-03-07 DIAGNOSIS — R509 Fever, unspecified: Secondary | ICD-10-CM | POA: Diagnosis not present

## 2019-03-29 DIAGNOSIS — L820 Inflamed seborrheic keratosis: Secondary | ICD-10-CM | POA: Diagnosis not present

## 2019-03-29 DIAGNOSIS — L570 Actinic keratosis: Secondary | ICD-10-CM | POA: Diagnosis not present

## 2019-03-29 DIAGNOSIS — L304 Erythema intertrigo: Secondary | ICD-10-CM | POA: Diagnosis not present

## 2019-04-04 DIAGNOSIS — R102 Pelvic and perineal pain: Secondary | ICD-10-CM | POA: Diagnosis not present

## 2019-04-04 DIAGNOSIS — M545 Low back pain: Secondary | ICD-10-CM | POA: Diagnosis not present

## 2019-04-04 DIAGNOSIS — R110 Nausea: Secondary | ICD-10-CM | POA: Diagnosis not present

## 2019-04-04 DIAGNOSIS — G8929 Other chronic pain: Secondary | ICD-10-CM | POA: Diagnosis not present

## 2019-04-26 DIAGNOSIS — J3489 Other specified disorders of nose and nasal sinuses: Secondary | ICD-10-CM | POA: Diagnosis not present

## 2019-04-26 DIAGNOSIS — Z20828 Contact with and (suspected) exposure to other viral communicable diseases: Secondary | ICD-10-CM | POA: Diagnosis not present

## 2019-05-02 DIAGNOSIS — M545 Low back pain: Secondary | ICD-10-CM | POA: Diagnosis not present

## 2019-05-02 DIAGNOSIS — R102 Pelvic and perineal pain: Secondary | ICD-10-CM | POA: Diagnosis not present

## 2019-05-02 DIAGNOSIS — J9801 Acute bronchospasm: Secondary | ICD-10-CM | POA: Diagnosis not present

## 2019-05-02 DIAGNOSIS — F419 Anxiety disorder, unspecified: Secondary | ICD-10-CM | POA: Diagnosis not present

## 2019-06-15 DIAGNOSIS — R102 Pelvic and perineal pain: Secondary | ICD-10-CM | POA: Diagnosis not present

## 2019-06-15 DIAGNOSIS — M545 Low back pain: Secondary | ICD-10-CM | POA: Diagnosis not present

## 2019-06-15 DIAGNOSIS — G8929 Other chronic pain: Secondary | ICD-10-CM | POA: Diagnosis not present

## 2019-06-15 DIAGNOSIS — R110 Nausea: Secondary | ICD-10-CM | POA: Diagnosis not present

## 2019-07-12 DIAGNOSIS — I10 Essential (primary) hypertension: Secondary | ICD-10-CM | POA: Diagnosis not present

## 2019-07-12 DIAGNOSIS — Z139 Encounter for screening, unspecified: Secondary | ICD-10-CM | POA: Diagnosis not present

## 2019-07-12 DIAGNOSIS — Z1331 Encounter for screening for depression: Secondary | ICD-10-CM | POA: Diagnosis not present

## 2019-07-12 DIAGNOSIS — Z9181 History of falling: Secondary | ICD-10-CM | POA: Diagnosis not present

## 2019-07-14 DIAGNOSIS — E559 Vitamin D deficiency, unspecified: Secondary | ICD-10-CM | POA: Diagnosis not present

## 2019-07-14 DIAGNOSIS — E785 Hyperlipidemia, unspecified: Secondary | ICD-10-CM | POA: Diagnosis not present

## 2019-07-14 DIAGNOSIS — E118 Type 2 diabetes mellitus with unspecified complications: Secondary | ICD-10-CM | POA: Diagnosis not present

## 2019-08-07 DIAGNOSIS — G8929 Other chronic pain: Secondary | ICD-10-CM | POA: Diagnosis not present

## 2019-08-07 DIAGNOSIS — R102 Pelvic and perineal pain: Secondary | ICD-10-CM | POA: Diagnosis not present

## 2019-08-07 DIAGNOSIS — J9801 Acute bronchospasm: Secondary | ICD-10-CM | POA: Diagnosis not present

## 2019-08-07 DIAGNOSIS — M545 Low back pain: Secondary | ICD-10-CM | POA: Diagnosis not present

## 2019-09-04 DIAGNOSIS — G8929 Other chronic pain: Secondary | ICD-10-CM | POA: Diagnosis not present

## 2019-09-04 DIAGNOSIS — R110 Nausea: Secondary | ICD-10-CM | POA: Diagnosis not present

## 2019-09-04 DIAGNOSIS — R102 Pelvic and perineal pain: Secondary | ICD-10-CM | POA: Diagnosis not present

## 2019-09-04 DIAGNOSIS — M545 Low back pain: Secondary | ICD-10-CM | POA: Diagnosis not present

## 2019-09-19 DIAGNOSIS — H25093 Other age-related incipient cataract, bilateral: Secondary | ICD-10-CM | POA: Diagnosis not present

## 2019-10-03 DIAGNOSIS — R102 Pelvic and perineal pain: Secondary | ICD-10-CM | POA: Diagnosis not present

## 2019-10-03 DIAGNOSIS — G8929 Other chronic pain: Secondary | ICD-10-CM | POA: Diagnosis not present

## 2019-10-03 DIAGNOSIS — M545 Low back pain: Secondary | ICD-10-CM | POA: Diagnosis not present

## 2019-10-03 DIAGNOSIS — R110 Nausea: Secondary | ICD-10-CM | POA: Diagnosis not present

## 2019-10-12 DIAGNOSIS — R079 Chest pain, unspecified: Secondary | ICD-10-CM | POA: Diagnosis not present

## 2019-10-12 DIAGNOSIS — R457 State of emotional shock and stress, unspecified: Secondary | ICD-10-CM | POA: Diagnosis not present

## 2019-10-12 DIAGNOSIS — R0789 Other chest pain: Secondary | ICD-10-CM | POA: Diagnosis not present

## 2019-11-07 DIAGNOSIS — R102 Pelvic and perineal pain: Secondary | ICD-10-CM | POA: Diagnosis not present

## 2019-11-07 DIAGNOSIS — R110 Nausea: Secondary | ICD-10-CM | POA: Diagnosis not present

## 2019-11-07 DIAGNOSIS — G8929 Other chronic pain: Secondary | ICD-10-CM | POA: Diagnosis not present

## 2019-11-07 DIAGNOSIS — N390 Urinary tract infection, site not specified: Secondary | ICD-10-CM | POA: Diagnosis not present

## 2019-12-06 ENCOUNTER — Other Ambulatory Visit: Payer: Self-pay | Admitting: Nurse Practitioner

## 2019-12-06 DIAGNOSIS — E559 Vitamin D deficiency, unspecified: Secondary | ICD-10-CM | POA: Diagnosis not present

## 2019-12-06 DIAGNOSIS — F419 Anxiety disorder, unspecified: Secondary | ICD-10-CM | POA: Diagnosis not present

## 2019-12-06 DIAGNOSIS — N644 Mastodynia: Secondary | ICD-10-CM

## 2019-12-06 DIAGNOSIS — Z6832 Body mass index (BMI) 32.0-32.9, adult: Secondary | ICD-10-CM | POA: Diagnosis not present

## 2019-12-06 DIAGNOSIS — E785 Hyperlipidemia, unspecified: Secondary | ICD-10-CM | POA: Diagnosis not present

## 2019-12-06 DIAGNOSIS — Z79899 Other long term (current) drug therapy: Secondary | ICD-10-CM | POA: Diagnosis not present

## 2019-12-06 DIAGNOSIS — K219 Gastro-esophageal reflux disease without esophagitis: Secondary | ICD-10-CM | POA: Diagnosis not present

## 2019-12-06 DIAGNOSIS — E118 Type 2 diabetes mellitus with unspecified complications: Secondary | ICD-10-CM | POA: Diagnosis not present

## 2019-12-06 DIAGNOSIS — R0602 Shortness of breath: Secondary | ICD-10-CM | POA: Diagnosis not present

## 2019-12-22 ENCOUNTER — Other Ambulatory Visit: Payer: Self-pay

## 2019-12-22 ENCOUNTER — Ambulatory Visit
Admission: RE | Admit: 2019-12-22 | Discharge: 2019-12-22 | Disposition: A | Discharge status: Home / Self Care | Payer: BC Managed Care – PPO | Source: Ambulatory Visit | Attending: Nurse Practitioner | Admitting: Nurse Practitioner

## 2019-12-22 ENCOUNTER — Other Ambulatory Visit: Payer: Self-pay | Admitting: Nurse Practitioner

## 2019-12-22 ENCOUNTER — Ambulatory Visit
Admission: RE | Admit: 2019-12-22 | Discharge: 2019-12-22 | Disposition: A | Discharge status: Home / Self Care | Payer: BLUE CROSS/BLUE SHIELD | Source: Ambulatory Visit | Attending: Nurse Practitioner | Admitting: Nurse Practitioner

## 2019-12-22 DIAGNOSIS — N6489 Other specified disorders of breast: Secondary | ICD-10-CM

## 2019-12-22 DIAGNOSIS — N644 Mastodynia: Secondary | ICD-10-CM

## 2019-12-22 DIAGNOSIS — R928 Other abnormal and inconclusive findings on diagnostic imaging of breast: Secondary | ICD-10-CM | POA: Diagnosis not present

## 2020-01-05 DIAGNOSIS — E118 Type 2 diabetes mellitus with unspecified complications: Secondary | ICD-10-CM | POA: Diagnosis not present

## 2020-01-05 DIAGNOSIS — E785 Hyperlipidemia, unspecified: Secondary | ICD-10-CM | POA: Diagnosis not present

## 2020-01-05 DIAGNOSIS — M545 Low back pain: Secondary | ICD-10-CM | POA: Diagnosis not present

## 2020-02-02 DIAGNOSIS — R102 Pelvic and perineal pain: Secondary | ICD-10-CM | POA: Diagnosis not present

## 2020-02-02 DIAGNOSIS — E118 Type 2 diabetes mellitus with unspecified complications: Secondary | ICD-10-CM | POA: Diagnosis not present

## 2020-02-02 DIAGNOSIS — K219 Gastro-esophageal reflux disease without esophagitis: Secondary | ICD-10-CM | POA: Diagnosis not present

## 2020-02-02 DIAGNOSIS — M25552 Pain in left hip: Secondary | ICD-10-CM | POA: Diagnosis not present

## 2020-02-29 DIAGNOSIS — R030 Elevated blood-pressure reading, without diagnosis of hypertension: Secondary | ICD-10-CM | POA: Diagnosis not present

## 2020-02-29 DIAGNOSIS — M545 Low back pain, unspecified: Secondary | ICD-10-CM | POA: Diagnosis not present

## 2020-04-01 DIAGNOSIS — M545 Low back pain, unspecified: Secondary | ICD-10-CM | POA: Diagnosis not present

## 2020-04-01 DIAGNOSIS — M25559 Pain in unspecified hip: Secondary | ICD-10-CM | POA: Diagnosis not present

## 2020-05-02 DIAGNOSIS — K219 Gastro-esophageal reflux disease without esophagitis: Secondary | ICD-10-CM | POA: Diagnosis not present

## 2020-05-02 DIAGNOSIS — E559 Vitamin D deficiency, unspecified: Secondary | ICD-10-CM | POA: Diagnosis not present

## 2020-05-02 DIAGNOSIS — E118 Type 2 diabetes mellitus with unspecified complications: Secondary | ICD-10-CM | POA: Diagnosis not present

## 2020-05-02 DIAGNOSIS — E785 Hyperlipidemia, unspecified: Secondary | ICD-10-CM | POA: Diagnosis not present

## 2020-05-02 DIAGNOSIS — L570 Actinic keratosis: Secondary | ICD-10-CM | POA: Diagnosis not present

## 2020-05-02 DIAGNOSIS — Z6831 Body mass index (BMI) 31.0-31.9, adult: Secondary | ICD-10-CM | POA: Diagnosis not present

## 2020-05-02 DIAGNOSIS — M545 Low back pain, unspecified: Secondary | ICD-10-CM | POA: Diagnosis not present

## 2020-05-02 DIAGNOSIS — H6123 Impacted cerumen, bilateral: Secondary | ICD-10-CM | POA: Diagnosis not present

## 2020-06-17 DIAGNOSIS — E118 Type 2 diabetes mellitus with unspecified complications: Secondary | ICD-10-CM | POA: Diagnosis not present

## 2020-06-17 DIAGNOSIS — E785 Hyperlipidemia, unspecified: Secondary | ICD-10-CM | POA: Diagnosis not present

## 2020-06-17 DIAGNOSIS — M545 Low back pain, unspecified: Secondary | ICD-10-CM | POA: Diagnosis not present

## 2020-06-26 ENCOUNTER — Other Ambulatory Visit: Payer: Self-pay

## 2020-06-26 ENCOUNTER — Ambulatory Visit
Admission: RE | Admit: 2020-06-26 | Discharge: 2020-06-26 | Disposition: A | Discharge status: Home / Self Care | Payer: BC Managed Care – PPO | Source: Ambulatory Visit | Attending: Nurse Practitioner | Admitting: Nurse Practitioner

## 2020-06-26 ENCOUNTER — Other Ambulatory Visit: Payer: Self-pay | Admitting: Nurse Practitioner

## 2020-06-26 DIAGNOSIS — N6489 Other specified disorders of breast: Secondary | ICD-10-CM | POA: Diagnosis not present

## 2020-06-26 DIAGNOSIS — R922 Inconclusive mammogram: Secondary | ICD-10-CM | POA: Diagnosis not present

## 2020-07-17 DIAGNOSIS — G8929 Other chronic pain: Secondary | ICD-10-CM | POA: Diagnosis not present

## 2020-07-17 DIAGNOSIS — M545 Low back pain, unspecified: Secondary | ICD-10-CM | POA: Diagnosis not present

## 2020-07-17 DIAGNOSIS — N390 Urinary tract infection, site not specified: Secondary | ICD-10-CM | POA: Diagnosis not present

## 2020-07-17 DIAGNOSIS — R110 Nausea: Secondary | ICD-10-CM | POA: Diagnosis not present

## 2020-08-16 DIAGNOSIS — M545 Low back pain, unspecified: Secondary | ICD-10-CM | POA: Diagnosis not present

## 2020-09-12 DIAGNOSIS — M545 Low back pain, unspecified: Secondary | ICD-10-CM | POA: Diagnosis not present

## 2020-09-12 DIAGNOSIS — K219 Gastro-esophageal reflux disease without esophagitis: Secondary | ICD-10-CM | POA: Diagnosis not present

## 2020-09-12 DIAGNOSIS — R102 Pelvic and perineal pain: Secondary | ICD-10-CM | POA: Diagnosis not present

## 2020-09-12 DIAGNOSIS — E118 Type 2 diabetes mellitus with unspecified complications: Secondary | ICD-10-CM | POA: Diagnosis not present

## 2020-10-10 DIAGNOSIS — M545 Low back pain, unspecified: Secondary | ICD-10-CM | POA: Diagnosis not present

## 2020-10-10 DIAGNOSIS — E118 Type 2 diabetes mellitus with unspecified complications: Secondary | ICD-10-CM | POA: Diagnosis not present

## 2020-10-10 DIAGNOSIS — E559 Vitamin D deficiency, unspecified: Secondary | ICD-10-CM | POA: Diagnosis not present

## 2020-10-10 DIAGNOSIS — F419 Anxiety disorder, unspecified: Secondary | ICD-10-CM | POA: Diagnosis not present

## 2020-11-19 DIAGNOSIS — G8929 Other chronic pain: Secondary | ICD-10-CM | POA: Diagnosis not present

## 2020-11-19 DIAGNOSIS — R102 Pelvic and perineal pain: Secondary | ICD-10-CM | POA: Diagnosis not present

## 2020-11-19 DIAGNOSIS — N390 Urinary tract infection, site not specified: Secondary | ICD-10-CM | POA: Diagnosis not present

## 2020-11-19 DIAGNOSIS — M545 Low back pain, unspecified: Secondary | ICD-10-CM | POA: Diagnosis not present

## 2020-12-19 DIAGNOSIS — M545 Low back pain, unspecified: Secondary | ICD-10-CM | POA: Diagnosis not present

## 2020-12-19 DIAGNOSIS — Z6830 Body mass index (BMI) 30.0-30.9, adult: Secondary | ICD-10-CM | POA: Diagnosis not present

## 2020-12-19 DIAGNOSIS — E538 Deficiency of other specified B group vitamins: Secondary | ICD-10-CM | POA: Diagnosis not present

## 2020-12-19 DIAGNOSIS — E559 Vitamin D deficiency, unspecified: Secondary | ICD-10-CM | POA: Diagnosis not present

## 2020-12-19 DIAGNOSIS — R5383 Other fatigue: Secondary | ICD-10-CM | POA: Diagnosis not present

## 2020-12-19 DIAGNOSIS — Z79899 Other long term (current) drug therapy: Secondary | ICD-10-CM | POA: Diagnosis not present

## 2020-12-19 DIAGNOSIS — E118 Type 2 diabetes mellitus with unspecified complications: Secondary | ICD-10-CM | POA: Diagnosis not present

## 2020-12-19 DIAGNOSIS — E785 Hyperlipidemia, unspecified: Secondary | ICD-10-CM | POA: Diagnosis not present

## 2020-12-26 ENCOUNTER — Telehealth: Payer: Self-pay | Admitting: Physician Assistant

## 2020-12-26 NOTE — Telephone Encounter (Signed)
What are her BMs?  Is she constipated? Has hx of diverticulosis and thus diverticulitis is possible She has not been seen here since 2019 Ok to check CBC, CMP Can consider empiric treatment for diverticulosis, though kidney stones could give similar symptoms I will await BM info and then decide She needs an APP visit too

## 2020-12-26 NOTE — Telephone Encounter (Signed)
Pt scheduled for an appt with Nicoletta Ba PA on 01/01/21 @ 1:30. Pt encouraged to take Miralax 17g daily. If pt worsens pt aware to contact PCP or go to urgent care.

## 2020-12-26 NOTE — Telephone Encounter (Signed)
Patient called states every time she eats she starts getting a sharp pain on her pelvic area and then she vomits.

## 2020-12-26 NOTE — Telephone Encounter (Signed)
APP visit Would recommend Miralax 17g daily in the meantime to help with BMs If worsening before she can be seen then urgent care or PCP I do not feel comfortable prescribing abx until she is seen and examined JMP

## 2020-12-26 NOTE — Telephone Encounter (Signed)
Pt states that she has had nausea and pain  for 4 weeks now; in the last 5 days she has now experiencing some episodes of emesis. She sates that about an hour after she eats is when the pain is on a scale 10/10 and she is very nauseated. She states that the  pain is pressure in the pelvic floor, right side of abdomen and radiates around to her  back. Please advise

## 2020-12-26 NOTE — Telephone Encounter (Signed)
Pt sates that she has a hx of constipation and normally goes 2 to 3 days in between having a BM. Pt stated her last BM was last night.

## 2021-01-01 ENCOUNTER — Ambulatory Visit: Payer: BC Managed Care – PPO | Admitting: Physician Assistant

## 2021-01-01 ENCOUNTER — Telehealth: Payer: Self-pay | Admitting: Internal Medicine

## 2021-01-01 NOTE — Telephone Encounter (Signed)
Pt rescheduled to see Amy Esterwood PA 01/09/21 at 1:30pm. Pt aware of appt.

## 2021-01-09 ENCOUNTER — Encounter: Payer: Self-pay | Admitting: Physician Assistant

## 2021-01-09 ENCOUNTER — Other Ambulatory Visit (INDEPENDENT_AMBULATORY_CARE_PROVIDER_SITE_OTHER): Payer: BC Managed Care – PPO

## 2021-01-09 ENCOUNTER — Ambulatory Visit: Payer: BC Managed Care – PPO | Admitting: Physician Assistant

## 2021-01-09 VITALS — BP 100/74 | HR 100 | Ht 64.0 in | Wt 192.2 lb

## 2021-01-09 DIAGNOSIS — R6881 Early satiety: Secondary | ICD-10-CM

## 2021-01-09 DIAGNOSIS — R1013 Epigastric pain: Secondary | ICD-10-CM

## 2021-01-09 DIAGNOSIS — D369 Benign neoplasm, unspecified site: Secondary | ICD-10-CM

## 2021-01-09 DIAGNOSIS — R634 Abnormal weight loss: Secondary | ICD-10-CM

## 2021-01-09 DIAGNOSIS — R112 Nausea with vomiting, unspecified: Secondary | ICD-10-CM

## 2021-01-09 DIAGNOSIS — R1031 Right lower quadrant pain: Secondary | ICD-10-CM

## 2021-01-09 DIAGNOSIS — K8040 Calculus of bile duct with cholecystitis, unspecified, without obstruction: Secondary | ICD-10-CM

## 2021-01-09 LAB — CBC WITH DIFFERENTIAL/PLATELET
Basophils Absolute: 0 10*3/uL (ref 0.0–0.1)
Basophils Relative: 0.3 % (ref 0.0–3.0)
Eosinophils Absolute: 0.4 10*3/uL (ref 0.0–0.7)
Eosinophils Relative: 4.1 % (ref 0.0–5.0)
HCT: 45.1 % (ref 36.0–46.0)
Hemoglobin: 15 g/dL (ref 12.0–15.0)
Lymphocytes Relative: 39.5 % (ref 12.0–46.0)
Lymphs Abs: 3.9 10*3/uL (ref 0.7–4.0)
MCHC: 33.2 g/dL (ref 30.0–36.0)
MCV: 95.1 fl (ref 78.0–100.0)
Monocytes Absolute: 0.6 10*3/uL (ref 0.1–1.0)
Monocytes Relative: 5.8 % (ref 3.0–12.0)
Neutro Abs: 4.9 10*3/uL (ref 1.4–7.7)
Neutrophils Relative %: 50.3 % (ref 43.0–77.0)
Platelets: 278 10*3/uL (ref 150.0–400.0)
RBC: 4.74 Mil/uL (ref 3.87–5.11)
RDW: 13.2 % (ref 11.5–15.5)
WBC: 9.8 10*3/uL (ref 4.0–10.5)

## 2021-01-09 LAB — COMPREHENSIVE METABOLIC PANEL
ALT: 43 U/L — ABNORMAL HIGH (ref 0–35)
AST: 24 U/L (ref 0–37)
Albumin: 4.2 g/dL (ref 3.5–5.2)
Alkaline Phosphatase: 160 U/L — ABNORMAL HIGH (ref 39–117)
BUN: 10 mg/dL (ref 6–23)
CO2: 30 mEq/L (ref 19–32)
Calcium: 9.7 mg/dL (ref 8.4–10.5)
Chloride: 100 mEq/L (ref 96–112)
Creatinine, Ser: 0.86 mg/dL (ref 0.40–1.20)
GFR: 71.98 mL/min (ref >60.00)
Glucose, Bld: 117 mg/dL — ABNORMAL HIGH (ref 70–99)
Potassium: 4.4 mEq/L (ref 3.5–5.1)
Sodium: 138 mEq/L (ref 135–145)
Total Bilirubin: 0.5 mg/dL (ref 0.2–1.2)
Total Protein: 7.5 g/dL (ref 6.0–8.3)

## 2021-01-09 LAB — SEDIMENTATION RATE: Sed Rate: 21 mm/hr (ref 0–30)

## 2021-01-09 LAB — HIGH SENSITIVITY CRP: CRP, High Sensitivity: 1.63 mg/L (ref 0.000–5.000)

## 2021-01-09 LAB — LIPASE: Lipase: 12 U/L (ref 11.0–59.0)

## 2021-01-09 MED ORDER — PANTOPRAZOLE SODIUM 40 MG PO TBEC: 40.0000 mg | DELAYED_RELEASE_TABLET | Freq: Every morning | ORAL | 3 refills | Status: AC

## 2021-01-09 MED ORDER — ONDANSETRON 4 MG PO TBDP: 4.0000 mg | ORAL_TABLET | Freq: Four times a day (QID) | ORAL | 0 refills | Status: AC | PRN

## 2021-01-09 NOTE — Progress Notes (Signed)
Following message sent to Rhys Martini and April Pait:  Aleatha Borer, LPN  Jennette Dubin, April H RADIOLOGY [facility] REQUEST   [facility]  Phone: [phone]  Fax: [phone]   Imaging Ordered: CT A/P - ORAL CONTRAST ONLY   Diagnosis: Wt loss, early satiety, epigastric pain, RLQ abd pain, episodes of N/V   Ordering Provider: Nicoletta Ba, PA-C   Is a Prior Authorization needed? We are in the process of obtaining it now   Is the patient Diabetic? No   Does the patient have Hypertension? No   Does the patient have any implanted devices or hardware? No   Date of last BUN/Creat, if needed? Obtained today   Patient Weight? 192#   Is the patient able to get on the table? Yes   Has the patient been diagnosed with COVID? No   Is the patient waiting on COVID testing results? No   Thank you for your assistance!  [facility]

## 2021-01-09 NOTE — Patient Instructions (Signed)
It was my pleasure to provide care to you today. Based on our discussion, I am providing you with my recommendations below:  RECOMMENDATION(S):   PRESCRIPTION MEDICATION(S):   We have sent the following medication(s) to your pharmacy:  Zofran Protonix  NOTE: If your medication(s) requires a PRIOR AUTHORIZATION, we will receive notification from your pharmacy. Once received, the process to submit for approval may take up to 7-10 business days. You will be contacted about any denials we have received from your insurance company as well as alternatives recommended by your provider.  LABS:   Please proceed to the basement level for lab work before leaving today. Press "B" on the elevator. The lab is located at the first door on the left as you exit the elevator.  HEALTHCARE LAWS AND MY CHART RESULTS:   Due to recent changes in healthcare laws, you may see results of your imaging and/or laboratory studies on MyChart before I have had a chance to review them.  I understand that in some cases there may be results that are confusing or concerning to you. Please understand that not all results are received at same time and often I may need to interpret multiple results in order to provide you with the best plan of care or course of treatment. Therefore, I ask that you please give me 48 hours to thoroughly review all your results before contacting my office for clarification.   IMAGING:  You will be contacted by [facility] (Your caller ID will indicate phone # [phone]) within the next business 7-10 business days to schedule your CT ABDOMEN AND PELVIS. If you have not heard from them within 7-10 business days, please call [facility] at [phone] to follow up on the status of your appointment.    FOLLOW UP:  I would like for you to follow up with me AS NEEDED. Please call the office at [phone] to schedule your appointment.  BMI:  If you are  age 63 or younger, your body mass index should be between 19-25. Your There is no height or weight on file to calculate BMI. If this is out of the aformentioned range listed, please consider follow up with your Primary Care Provider.   MY CHART:  The [facility] providers would like to encourage you to use [facility] to communicate with providers for non-urgent requests or questions.  Due to long hold times on the telephone, sending your provider a message by [facility] may be a faster and more efficient way to get a response.  Please allow 48 business hours for a response.  Please remember that this is for non-urgent requests.   Thank you for trusting me with your gastrointestinal care!    Amy Esterwood, PA-C

## 2021-01-09 NOTE — Progress Notes (Signed)
Addendum: Reviewed and agree with assessment and management plan. Mohammedali Bedoy M, MD  

## 2021-01-09 NOTE — Progress Notes (Signed)
Subjective:    Patient ID: Kathy Ferguson, female    DOB: 07-Jan-1956, 65 y.o.   MRN: [id number]  HPI Kathy Ferguson is a pleasant 65 year old white female established with Dr. Hilarie Fredrickson who comes in today with recent onset of episodic abdominal pain nausea vomiting.. Patient has history of GERD, Schatzki's ring which has required dilation in 2019, history of diverticulosis, history of acute cholecystitis and  choledocholithiasis 2019 at which time she was hospitalized at [facility] and underwent cholecystectomy and ERCP with stone extraction, also status post C-section. Last colonoscopy was done in 2019 with finding of multiple diverticuli, she had 2 5 mm polyps removed 1 of which was hyperplastic and one was a tubular adenoma and noted to have internal and external hemorrhoids.  Patient says that she has not been feeling well over the past several months, and has lost weight from 214 pounds to 192 pounds since February.  She describes symptoms of fullness and bloating/early satiety.  She says if she would try to eat a normal amount she would probably vomit.  She has been consistently eating smaller meals to manage those symptoms. About 3 weeks ago while she was on vacation in [place] she developed an episode of left-sided abdominal pain followed by pelvic pressure which she says was very uncomfortable and then within 24 hours or so developed right-sided abdominal pain which was more in the right upper quadrant fairly severe and described as sharp and grabbing in nature.  She eventually had nausea and vomiting and after she started having vomiting her symptoms gradually eased off.  By the following day she was still having pelvic pressure though not as bad.  No associated fever or chills.  A couple of days later she had another episode very similar with the right-sided abdominal pain more in the right upper right mid quadrant followed by nausea and vomiting. Now over the past week and a half she has been having some  intermittent aching in the right mid abdomen and occasional mild pelvic pressure but has not had any more severe episodes, no fever, no nausea or vomiting and no changes in bowel habits. She had called here, has started taking MiraLAX which is helping with her bowel movements.  She is remained on Protonix 40 mg daily, and does have Zofran at home to use for nausea. She says she has never had any of the symptoms previously and is concerned.  Review of Systems. Pertinent positive and negative review of systems were noted in the above HPI section.  All other review of systems was otherwise negative.   Outpatient Encounter Medications as of 01/09/2021  Medication Sig   busPIRone (BUSPAR) 15 MG tablet Take 30 mg by mouth 2 (two) times daily.   cyclobenzaprine (FLEXERIL) 10 MG tablet Take 10 mg by mouth as needed.    docusate sodium (COLACE) 100 MG capsule Take 100 mg by mouth 2 (two) times daily.   HYDROcodone-acetaminophen (NORCO) 10-325 MG tablet Take by mouth. Take 1 tablet by mouth every 4-6 hours daily   hydrOXYzine (ATARAX/VISTARIL) 25 MG tablet Take 25 mg by mouth as needed.   ibuprofen (ADVIL) 600 MG tablet Take 600 mg by mouth every 6 (six) hours.   nitrofurantoin (MACRODANTIN) 50 MG capsule Take 1 capsule by mouth as needed after intercourse   penicillin v potassium (VEETID) 500 MG tablet Take 500 mg by mouth 4 (four) times daily.   polyethylene glycol powder (GLYCOLAX/MIRALAX) 17 GM/SCOOP powder Take 17 g by mouth daily.  PROAIR HFA 108 (90 Base) MCG/ACT inhaler inahle 2 puff every 4-6 hours as needed   promethazine (PHENERGAN) 25 MG tablet Take 0.5 tablets (12.5 mg total) by mouth every 6 (six) hours as needed.   senna (SENOKOT) 8.6 MG TABS tablet Take 1 tablet by mouth daily as needed for mild constipation.   Vitamin D, Ergocalciferol, (DRISDOL) 1.25 MG (50000 UNIT) CAPS capsule Take 50,000 Units by mouth once a week.   [DISCONTINUED] ondansetron (ZOFRAN-ODT) 4 MG disintegrating tablet  Take 4 mg by mouth daily as needed for nausea or vomiting (uses first for nausea, if doesn't work takes her phenergan).   [DISCONTINUED] pantoprazole (PROTONIX) 40 MG tablet Take 1 tablet by mouth every morning.   fluconazole (DIFLUCAN) 150 MG tablet Take 1 tablet by mouth daily as needed. (Patient not taking: Reported on 01/09/2021)   Hyoscyamine Sulfate SL (LEVSIN/SL) 0.125 MG SUBL Dissolve 1 tablet on the tongue every 4-6 hours as needed for acute abdominal pain/spasms. (Patient not taking: No sig reported)   ondansetron (ZOFRAN-ODT) 4 MG disintegrating tablet Take 1 tablet (4 mg total) by mouth every 6 (six) hours as needed for nausea or vomiting (uses first for nausea, if doesn't work takes her phenergan).   pantoprazole (PROTONIX) 40 MG tablet Take 1 tablet (40 mg total) by mouth every morning. Take 1 tablet by mouth every morning.   [DISCONTINUED] oxyCODONE-acetaminophen (PERCOCET/ROXICET) 5-325 MG tablet Take by mouth.   [DISCONTINUED] predniSONE (DELTASONE) 10 MG tablet Take as directed for CT premedication. (Patient not taking: Reported on 03/23/2018)   [DISCONTINUED] predniSONE (DELTASONE) 50 MG tablet Take as directed for Ct scan pre medication. (Patient not taking: Reported on 03/23/2018)   Facility-Administered Encounter Medications as of 01/09/2021  Medication   0.9 %  sodium chloride infusion   Allergies  Allergen Reactions   Iohexol Hives     Code: HIVES, Desc: pt has hives/swelling with iv cm, Onset Date: [id number] Pt had hives after a 13 hr prep on last CT scan     Latex    Patient Active Problem List   Diagnosis Date Noted   Choledocholithiasis with cholecystitis 01/09/2021   Adenomatous polyp 01/09/2021   STRICTURE AND STENOSIS OF ESOPHAGUS 08/15/2008   ABDOMINAL WALL PAIN 06/20/2008   Diverticulitis of colon (without mention of hemorrhage)(562.11) 05/15/2008   Abdominal pain, left lower quadrant 05/15/2008   Social History   Socioeconomic History   Marital status:  Married    Spouse name: Not on file   Number of children: 2   Years of education: Not on file   Highest education level: Not on file  Occupational History   Occupation: Hotel manager  Tobacco Use   Smoking status: Every Day    Packs/day: 1.50    Types: Cigarettes   Smokeless tobacco: Never   Tobacco comments:    tobacco info given 01/27/18  Vaping Use   Vaping Use: Never used  Substance and Sexual Activity   Alcohol use: Yes    Comment: occasional   Drug use: No   Sexual activity: Not on file  Other Topics Concern   Not on file  Social History Narrative   Not on file   Social Determinants of Health   Financial Resource Strain: Not on file  Food Insecurity: Not on file  Transportation Needs: Not on file  Physical Activity: Not on file  Stress: Not on file  Social Connections: Not on file  Intimate Partner Violence: Not on file    Ms. Minkler's family history  includes Breast cancer in her mother; Colon polyps in her mother; Diabetes in her brother, father, maternal grandmother, paternal aunt, paternal aunt, paternal uncle, paternal uncle, and another family member; Stroke in her father, maternal grandmother, paternal aunt, paternal aunt, paternal uncle, paternal uncle, and another family member.      Objective:    Vitals:   01/09/21 1323  BP: 100/74  Pulse: 100    Physical Exam.Well-developed well-nourished female in no acute distress.  Height, Weight, 192 BMI 33  HEENT; nontraumatic normocephalic, EOMI, PE R LA, sclera anicteric. Oropharynx; not examined today Neck; supple, no JVD Cardiovascular; regular rate and rhythm with S1-S2, no murmur rub or gallop Pulmonary; Clear bilaterally Abdomen; soft, there is tenderness in the epigastrium and hypogastrium as well as the right lower quadrant no guarding or rebound ,nondistended, no palpable mass or hepatosplenomegaly, bowel sounds are active Rectal; not done today Skin; benign exam, no jaundice rash or  appreciable lesions Extremities; no clubbing cyanosis or edema skin warm and dry Neuro/Psych; alert and oriented x4, grossly nonfocal mood and affect appropriate        Assessment & Plan:   #81 65 year old white female status post cholecystectomy 2019 associated with choledocholithiasis status post ERCP and stone extraction. Current illness onset about 3 weeks ago initially with left-sided abdominal pain followed by significant low pelvic pain and pressure then migration of pain to the right mid and upper abdomen which became severe, sharp and stabbing eventually followed by nausea and vomiting and then gradual resolution of symptoms. She had another discrete episode about 3 to 4 days later very similar symptoms Since that time has been having aching right sided abdominal discomfort and some mild intermittent pelvic pressure.  In the background of this she has had several month history of abdominal fullness and early satiety sensation with weight loss of about 22 pounds. \ Etiology of her current symptoms is not clear, rule out ongoing intra-abdominal inflammatory process, query obstructive episodes, rule out neoplasm.  #2 chronic GERD and history of Schatzki's ring status post prior dilation-stable #3 diverticulosis #4 history of adenomatous and hyperplastic colon polyps-up-to-date with colonoscopy done in 2019, indicated for 7-year interval follow-up #5 status post C-section  Plan; CBC with differential, c-Met, sed rate, CRP, lipase\ Patient will be scheduled for CT scan of the abdomen and pelvis with oral but no IV contrast due to contrast allergy Continue small frequent meals Zofran 4 mg every 6 hours as needed for nausea Continue Protonix 40 mg p.o. every morning Continue MiraLAX 17 g in 8 ounces of water daily and has also been taking Senokot S 1 at bedtime which she can continue. Further recommendations depending on results of labs and CT.  If unrevealing and she may need  endoscopic evaluation with at least repeat EGD.  Alfredia Ferguson PA-C 01/09/2021   Cc: Ocie Doyne., MD

## 2021-01-14 ENCOUNTER — Telehealth: Payer: Self-pay | Admitting: Physician Assistant

## 2021-01-14 NOTE — Telephone Encounter (Signed)
Inbound call from patient. States her insurance OOP is $2600. Patient would like to have the referral to [facility] imaging.   Also, can patient pick up solution on Thursday by the office?   Best contact number [phone]

## 2021-01-15 ENCOUNTER — Other Ambulatory Visit: Payer: Self-pay

## 2021-01-15 DIAGNOSIS — R6881 Early satiety: Secondary | ICD-10-CM

## 2021-01-15 DIAGNOSIS — M545 Low back pain, unspecified: Secondary | ICD-10-CM | POA: Diagnosis not present

## 2021-01-15 DIAGNOSIS — R1013 Epigastric pain: Secondary | ICD-10-CM

## 2021-01-15 DIAGNOSIS — R112 Nausea with vomiting, unspecified: Secondary | ICD-10-CM

## 2021-01-15 DIAGNOSIS — R1032 Left lower quadrant pain: Secondary | ICD-10-CM

## 2021-01-15 DIAGNOSIS — R634 Abnormal weight loss: Secondary | ICD-10-CM

## 2021-01-15 NOTE — Telephone Encounter (Signed)
CT imaging moved to [place].  02/04/21 arrive at 12:20. Contrast and written instructions provided. Patient notified.

## 2021-01-16 ENCOUNTER — Ambulatory Visit (INDEPENDENT_AMBULATORY_CARE_PROVIDER_SITE_OTHER): Payer: BC Managed Care – PPO | Admitting: Endocrinology

## 2021-01-16 ENCOUNTER — Other Ambulatory Visit: Payer: Self-pay

## 2021-01-16 VITALS — BP 164/70 | HR 84 | Ht 64.0 in | Wt 193.2 lb

## 2021-01-16 DIAGNOSIS — E119 Type 2 diabetes mellitus without complications: Secondary | ICD-10-CM | POA: Insufficient documentation

## 2021-01-16 LAB — T4, FREE: Free T4: 0.66 ng/dL (ref 0.60–1.60)

## 2021-01-16 LAB — T3, FREE: T3, Free: 3.4 pg/mL (ref 2.3–4.2)

## 2021-01-16 LAB — TSH: TSH: 1.17 u[IU]/mL (ref 0.35–5.50)

## 2021-01-16 MED ORDER — GLUCOSE BLOOD VI STRP: 1.0000 | ORAL_STRIP | Freq: Every day | 12 refills | Status: AC

## 2021-01-16 MED ORDER — PIOGLITAZONE HCL 15 MG PO TABS: 15.0000 mg | ORAL_TABLET | Freq: Every day | ORAL | 3 refills | Status: AC

## 2021-01-16 MED ORDER — CONTOUR MONITOR DEVI: 1.0000 | Freq: Once | 0 refills | Status: AC

## 2021-01-16 NOTE — Patient Instructions (Addendum)
good diet and exercise significantly improve the control of your diabetes.  please let me know if you wish to be referred to a dietician.  high blood sugar is very risky to your health.  you should see an eye doctor and dentist every year.  It is very important to get all recommended vaccinations.  Controlling your blood pressure and cholesterol drastically reduces the damage diabetes does to your body.  Those who smoke should quit.  Please discuss these with your doctor.  I have sent a prescription to your pharmacy, for pioglitazone.   Blood tests are requested for you today.  We'll let you know about the results.   Please come back for a follow-up appointment in 2-3 months.

## 2021-01-16 NOTE — Progress Notes (Signed)
Appointment Information  Name: Elpha, Ryon MRN: [id number]  Date: 02/04/2021 Status: Sch  Time: 12:40 PM Length: 20  Visit Type: CT ABDOMEN PELVIS WO CONTRAST [id number] Copay: $0.00  Provider: GI-315 CT 1 Department: GI-315 CT IMAGING  Referring Provider: Alfredia Ferguson CSN: [id number]  Notes: BCBS COMM PPO no to all covid q's beth'@ofc'$  will give pt contrast/epic order  Made On: Change Notes: 01/15/2021 11:44 AM 01/15/2021 11:44 AM By: By: Orlinda Blalock

## 2021-01-16 NOTE — Progress Notes (Signed)
Subjective:    Patient ID: Kathy Ferguson, female    DOB: 08-06-1955, 65 y.o.   MRN: [id number]  HPI pt is referred by Collier Salina, NP, for diabetes.  Pt states DM was dx'ed in 2022 (she had GDM in 1991 and 1993). she is unaware of any chronic complications; he has never been on DM medication; pt says her diet and exercise are poor; she has never had pancreatitis, pancreatic surgery, severe hypoglycemia or DKA. She has lost 20 lbs x a few mos.  She is seeing GI for nausea.  She also has h/o chronic intermitt yeast vaginitis.   Past Medical History:  Diagnosis Date   Allergy    Anxiety    Cataract    formng   Diverticulosis    Dysphagia    GERD (gastroesophageal reflux disease)    Gestational diabetes    Heart murmur    mild    HTN (hypertension)    IBS (irritable bowel syndrome)    Pneumonia    Prediabetes     Past Surgical History:  Procedure Laterality Date   [place]  05/28/2017   at [facility]   COLONOSCOPY     ERCP  05/2017   at [facility] History   Socioeconomic History   Marital status: Married    Spouse name: Not on file   Number of children: 2   Years of education: Not on file   Highest education level: Not on file  Occupational History   Occupation: Hotel manager  Tobacco Use   Smoking status: Every Day    Packs/day: 1.50    Types: Cigarettes   Smokeless tobacco: Never   Tobacco comments:    tobacco info given 01/27/18  Vaping Use   Vaping Use: Never used  Substance and Sexual Activity   Alcohol use: Yes    Comment: occasional   Drug use: No   Sexual activity: Not on file  Other Topics Concern   Not on file  Social History Narrative   Not on file   Social Determinants of Health   Financial Resource Strain: Not on file  Food Insecurity: Not on file  Transportation Needs: Not on file  Physical Activity: Not on file  Stress: Not on file  Social Connections: Not on  file  Intimate Partner Violence: Not on file    Current Outpatient Medications on File Prior to Visit  Medication Sig Dispense Refill   busPIRone (BUSPAR) 15 MG tablet Take 30 mg by mouth 2 (two) times daily.  3   cyclobenzaprine (FLEXERIL) 10 MG tablet Take 10 mg by mouth as needed.   12   docusate sodium (COLACE) 100 MG capsule Take 100 mg by mouth 2 (two) times daily.     fluconazole (DIFLUCAN) 150 MG tablet Take 1 tablet by mouth daily as needed.  12   HYDROcodone-acetaminophen (NORCO) 10-325 MG tablet Take by mouth. Take 1 tablet by mouth every 4-6 hours daily  0   hydrOXYzine (ATARAX/VISTARIL) 25 MG tablet Take 25 mg by mouth as needed.     Hyoscyamine Sulfate SL (LEVSIN/SL) 0.125 MG SUBL Dissolve 1 tablet on the tongue every 4-6 hours as needed for acute abdominal pain/spasms. 50 each 1   ibuprofen (ADVIL) 600 MG tablet Take 600 mg by mouth every 6 (six) hours.     nitrofurantoin (MACRODANTIN) 50 MG capsule Take 1 capsule by mouth as needed  after intercourse  5   ondansetron (ZOFRAN-ODT) 4 MG disintegrating tablet Take 1 tablet (4 mg total) by mouth every 6 (six) hours as needed for nausea or vomiting (uses first for nausea, if doesn't work takes her phenergan). 30 tablet 0   pantoprazole (PROTONIX) 40 MG tablet Take 1 tablet (40 mg total) by mouth every morning. Take 1 tablet by mouth every morning. 90 tablet 3   penicillin v potassium (VEETID) 500 MG tablet Take 500 mg by mouth 4 (four) times daily.     polyethylene glycol powder (GLYCOLAX/MIRALAX) 17 GM/SCOOP powder Take 17 g by mouth daily.     PROAIR HFA 108 (90 Base) MCG/ACT inhaler inahle 2 puff every 4-6 hours as needed  12   promethazine (PHENERGAN) 25 MG tablet Take 0.5 tablets (12.5 mg total) by mouth every 6 (six) hours as needed. 30 tablet 1   senna (SENOKOT) 8.6 MG TABS tablet Take 1 tablet by mouth daily as needed for mild constipation.     Vitamin D, Ergocalciferol, (DRISDOL) 1.25 MG (50000 UNIT) CAPS capsule Take 50,000  Units by mouth once a week.     Current Facility-Administered Medications on File Prior to Visit  Medication Dose Route Frequency Provider Last Rate Last Admin   0.9 %  sodium chloride infusion  500 mL Intravenous Once Pyrtle, Lajuan Lines, MD        Allergies  Allergen Reactions   Iohexol Hives     Code: HIVES, Desc: pt has hives/swelling with iv cm, Onset Date: [id number] Pt had hives after a 13 hr prep on last CT scan     Latex     Family History  Problem Relation Age of Onset   Breast cancer Mother        hormone induced   Colon polyps Mother    Diabetes Father    Stroke Father    Diabetes Brother    Diabetes Maternal Grandmother    Stroke Maternal Grandmother    Diabetes Other        both sides of family   Stroke Other    Diabetes Paternal Aunt    Stroke Paternal Aunt    Diabetes Paternal Aunt    Stroke Paternal Aunt    Diabetes Paternal Uncle    Stroke Paternal Uncle    Diabetes Paternal Uncle    Stroke Paternal Uncle    Colon cancer Neg Hx    Esophageal cancer Neg Hx    Rectal cancer Neg Hx    Stomach cancer Neg Hx     BP (!) 164/70 (BP Location: Right Arm, Patient Position: Sitting, Cuff Size: Normal)   Pulse 84   Ht '5\' 4"'$  (1.626 m)   Wt 193 lb 3.2 oz (87.6 kg)   SpO2 95%   BMI 33.16 kg/m     Review of Systems denies sob.      Objective:   Physical Exam Pulses: dorsalis pedis intact bilat.   MSK: no deformity of the feet CV: no leg edema Skin:  no ulcer on the feet.  normal color and temp on the feet. Neuro: sensation is intact to touch on the feet Ext: there is bilateral onychomycosis of the toenails.   outside test results are reviewed:  A1c=7.1% Today: A1c=6.8%  I have reviewed outside records, and summarized: Pt was noted to have elevated A1c, and referred here.  Metformin was considered, but pt reported nausea.       Assessment & Plan:  Type 2 DM: uncontrolled Vaginitis: This  limits rx options Nausea: This also limits rx  options.  Patient Instructions  Ferguson diet and exercise significantly improve the control of your diabetes.  please let me know if you wish to be referred to a dietician.  high blood sugar is very risky to your health.  you should see an eye doctor and dentist every year.  It is very important to get all recommended vaccinations.  Controlling your blood pressure and cholesterol drastically reduces the damage diabetes does to your body.  Those who smoke should quit.  Please discuss these with your doctor.  I have sent a prescription to your pharmacy, for pioglitazone.   Blood tests are requested for you today.  We'll let you know about the results.   Please come back for a follow-up appointment in 2-3 months.

## 2021-01-21 ENCOUNTER — Encounter (HOSPITAL_COMMUNITY): Payer: Self-pay

## 2021-01-21 ENCOUNTER — Ambulatory Visit (HOSPITAL_COMMUNITY): Payer: BC Managed Care – PPO

## 2021-01-24 ENCOUNTER — Ambulatory Visit
Admission: RE | Admit: 2021-01-24 | Discharge: 2021-01-24 | Disposition: A | Discharge status: Home / Self Care | Payer: BC Managed Care – PPO | Source: Ambulatory Visit | Attending: Nurse Practitioner | Admitting: Nurse Practitioner

## 2021-01-24 ENCOUNTER — Other Ambulatory Visit: Payer: Self-pay

## 2021-01-24 DIAGNOSIS — N6489 Other specified disorders of breast: Secondary | ICD-10-CM | POA: Diagnosis not present

## 2021-01-24 DIAGNOSIS — R922 Inconclusive mammogram: Secondary | ICD-10-CM | POA: Diagnosis not present

## 2021-02-03 ENCOUNTER — Ambulatory Visit: Payer: BC Managed Care – PPO | Admitting: Nutrition

## 2021-02-04 ENCOUNTER — Other Ambulatory Visit: Payer: BC Managed Care – PPO

## 2021-02-04 ENCOUNTER — Ambulatory Visit
Admission: RE | Admit: 2021-02-04 | Discharge: 2021-02-04 | Disposition: A | Discharge status: Home / Self Care | Payer: BC Managed Care – PPO | Source: Ambulatory Visit | Attending: Physician Assistant | Admitting: Physician Assistant

## 2021-02-04 DIAGNOSIS — R109 Unspecified abdominal pain: Secondary | ICD-10-CM | POA: Diagnosis not present

## 2021-02-04 DIAGNOSIS — K5730 Diverticulosis of large intestine without perforation or abscess without bleeding: Secondary | ICD-10-CM | POA: Diagnosis not present

## 2021-02-04 DIAGNOSIS — R1013 Epigastric pain: Secondary | ICD-10-CM

## 2021-02-04 DIAGNOSIS — R634 Abnormal weight loss: Secondary | ICD-10-CM

## 2021-02-04 DIAGNOSIS — R6881 Early satiety: Secondary | ICD-10-CM

## 2021-02-04 DIAGNOSIS — R1032 Left lower quadrant pain: Secondary | ICD-10-CM

## 2021-02-04 DIAGNOSIS — R1031 Right lower quadrant pain: Secondary | ICD-10-CM | POA: Diagnosis not present

## 2021-02-04 DIAGNOSIS — R112 Nausea with vomiting, unspecified: Secondary | ICD-10-CM

## 2021-02-06 ENCOUNTER — Telehealth: Payer: Self-pay | Admitting: Physician Assistant

## 2021-02-06 NOTE — Telephone Encounter (Signed)
Inbound call from patient requesting a call back for CT results.

## 2021-02-07 NOTE — Telephone Encounter (Signed)
See result note.  

## 2021-02-12 DIAGNOSIS — M545 Low back pain, unspecified: Secondary | ICD-10-CM | POA: Diagnosis not present

## 2021-02-12 DIAGNOSIS — Z6829 Body mass index (BMI) 29.0-29.9, adult: Secondary | ICD-10-CM | POA: Diagnosis not present

## 2021-02-12 DIAGNOSIS — Z79899 Other long term (current) drug therapy: Secondary | ICD-10-CM | POA: Diagnosis not present

## 2021-02-18 ENCOUNTER — Other Ambulatory Visit: Payer: Self-pay

## 2021-02-18 ENCOUNTER — Encounter: Payer: Self-pay | Admitting: Dietician

## 2021-02-18 ENCOUNTER — Encounter: Payer: BC Managed Care – PPO | Attending: Endocrinology | Admitting: Dietician

## 2021-02-18 DIAGNOSIS — E119 Type 2 diabetes mellitus without complications: Secondary | ICD-10-CM | POA: Diagnosis not present

## 2021-02-18 NOTE — Progress Notes (Signed)
Diabetes Self-Management Education  Visit Type: First/Initial  Appt. Start Time: 1100 Appt. End Time: 1230  02/18/2021  Kathy Ferguson, identified by name and date of birth, is a 65 y.o. female with a diagnosis of Diabetes: Type 2.   ASSESSMENT Patient is here today alone. She is a patient of Dr. Loanne Drilling and was referred for type 2 diabetes. Patient would like a meal plan.  She states that she is working to cut down on the carbohydrates.  States that she has a sweet tooth and enjoys sweet tea.  Artificial sweeteners bother her stomach.  She does like and tolerate Monk fruit sweetener (without Erithrotol).  Her cousin just lost his leg due to diabetes.  All family have diabetes.  She was unable to test her blood glucose as she could not figure out how to use the lancing devise.   Provided lancing devices and lancets.  Patient was able to demonstrate use.  Glucose was 144 (drinking sweet tea).  History includes Type 2 Diabetes (2022) , GDM during her first and second pregnancies (1991 and 1993), diverticulosis, GERD, HTN, IBS (constipation), smokes (she states that she will cut down but won't quit)  Lost 20 lbs in the past several months due to stomach problems (nausea).  She is followed by GI.  Patient lives with her husband.  She does the shopping and cooking. They own a Psychiatrist.   Does not tolerate broccoli.  Diarrhea with milk and yogurt. Elliptical in the shop at home.  Motivation is a barrier.    Weight 196 lb (88.9 kg). Body mass index is 33.64 kg/m.   Diabetes Self-Management Education - 02/18/21 1150       Visit Information   Visit Type First/Initial      Initial Visit   Diabetes Type Type 2    Are you currently following a meal plan? No    Are you taking your medications as prescribed? No    Date Diagnosed 2022      Psychosocial Assessment   Patient Belief/Attitude about Diabetes Motivated to manage diabetes    Self-care barriers None     Self-management support Doctor's office    Other persons present Patient    Patient Concerns Nutrition/Meal planning;Glycemic Control;Healthy Lifestyle;Monitoring;Weight Control    Special Needs None    Preferred Learning Style No preference indicated    Learning Readiness Ready    How often do you need to have someone help you when you read instructions, pamphlets, or other written materials from your doctor or pharmacy? 1 - Never    What is the last grade level you completed in school? 2 years college      Pre-Education Assessment   Patient understands the diabetes disease and treatment process. Needs Instruction    Patient understands incorporating nutritional management into lifestyle. Needs Instruction    Patient undertands incorporating physical activity into lifestyle. Needs Instruction    Patient understands using medications safely. Needs Instruction    Patient understands monitoring blood glucose, interpreting and using results Needs Instruction    Patient understands prevention, detection, and treatment of acute complications. Needs Instruction    Patient understands prevention, detection, and treatment of chronic complications. Needs Instruction    Patient understands how to develop strategies to address psychosocial issues. Needs Instruction    Patient understands how to develop strategies to promote health/change behavior. Needs Instruction      Complications   Last HgB A1C per patient/outside source 6.8 %   01/16/2021 decreased from  7.1%   How often do you check your blood sugar? 0 times/day (not testing)    Have you had a dilated eye exam in the past 12 months? No    Have you had a dental exam in the past 12 months? Yes    Are you checking your feet? Yes    How many days per week are you checking your feet? 1      Dietary Intake   Breakfast none    Snack (morning) none    Lunch chicken, cheese, and mushroom quesadilla,liver onions, green beans, applesauce OR 2 slices  pizza OR chicken parmesan OR lasagna OR spaghetti with meat sauce AND side salad OR deli sandwich    Snack (afternoon) none    Dinner mac and cheese, peas, fried salmon patties OR hamburger steak, mushroom soup, rice, peas OR grilled meat and salad OR tuna salad on greens    Snack (evening) hagan daz ice cream and oatmeal raisin cookies    Beverage(s) regular sweet tea 64 oz or more daily      Exercise   Exercise Type ADL's   Lazy     Patient Education   Previous Diabetes Education Yes (please comment)   GDM   Disease state  Definition of diabetes, type 1 and 2, and the diagnosis of diabetes    Nutrition management  Food label reading, portion sizes and measuring food.;Meal options for control of blood glucose level and chronic complications.;Information on hints to eating out and maintain blood glucose control.;Effects of alcohol on blood glucose and safety factors with consumption of alcohol.;Role of diet in the treatment of diabetes and the relationship between the three main macronutrients and blood glucose level    Physical activity and exercise  Role of exercise on diabetes management, blood pressure control and cardiac health.    Medications Reviewed patients medication for diabetes, action, purpose, timing of dose and side effects.    Monitoring Taught/evaluated SMBG meter.;Identified appropriate SMBG and/or A1C goals.;Taught/discussed recording of test results and interpretation of SMBG.;Yearly dilated eye exam;Daily foot exams    Acute complications Taught treatment of hypoglycemia - the 15 rule.;Discussed and identified patients' treatment of hyperglycemia.    Chronic complications Relationship between chronic complications and blood glucose control    Psychosocial adjustment Worked with patient to identify barriers to care and solutions;Role of stress on diabetes;Identified and addressed patients feelings and concerns about diabetes    Personal strategies to promote health Lifestyle  issues that need to be addressed for better diabetes care      Individualized Goals (developed by patient)   Nutrition General guidelines for healthy choices and portions discussed    Physical Activity Exercise 5-7 days per week;30 minutes per day    Medications Other (comment)   patient chooses to focus on lifestyle   Monitoring  test my blood glucose as discussed    Reducing Risk increase portions of healthy fats;do foot checks daily;stop smoking      Post-Education Assessment   Patient understands the diabetes disease and treatment process. Demonstrates understanding / competency    Patient understands incorporating nutritional management into lifestyle. Needs Review    Patient undertands incorporating physical activity into lifestyle. Demonstrates understanding / competency    Patient understands using medications safely. Demonstrates understanding / competency    Patient understands monitoring blood glucose, interpreting and using results Demonstrates understanding / competency    Patient understands prevention, detection, and treatment of acute complications. Demonstrates understanding / competency    Patient  understands prevention, detection, and treatment of chronic complications. Demonstrates understanding / competency    Patient understands how to develop strategies to address psychosocial issues. Demonstrates understanding / competency    Patient understands how to develop strategies to promote health/change behavior. Needs Review      Outcomes   Expected Outcomes Demonstrated interest in learning. Expect positive outcomes    Future DMSE 2 months    Program Status Not Completed             Individualized Plan for Diabetes Self-Management Training:   Learning Objective:  Patient will have a greater understanding of diabetes self-management. Patient education plan is to attend individual and/or group sessions per assessed needs and concerns.   Plan:   Patient  Instructions  Rethink your sweet tea.  Consider changing sweetener to [facility] fruit.  Elliptical after dinner or walk after lunch most days.  Mindful choices. Choices Portions Lower fat Amount of dressing/mayo   Expected Outcomes:  Demonstrated interest in learning. Expect positive outcomes  Education material provided: ADA - How to Thrive: A Guide for Your Journey with Diabetes, Food label handouts, Meal plan card, Snack sheet, and Diabetes Resources; eating out tips  If problems or questions, patient to contact team via:  Phone  Future DSME appointment: 2 months

## 2021-02-18 NOTE — Patient Instructions (Signed)
Rethink your sweet tea.  Consider changing sweetener to [facility] fruit.  Elliptical after dinner or walk after lunch most days.  Mindful choices. Choices Portions Lower fat Amount of dressing/mayo

## 2021-02-19 DIAGNOSIS — D2239 Melanocytic nevi of other parts of face: Secondary | ICD-10-CM | POA: Diagnosis not present

## 2021-02-19 DIAGNOSIS — L570 Actinic keratosis: Secondary | ICD-10-CM | POA: Diagnosis not present

## 2021-02-19 DIAGNOSIS — L814 Other melanin hyperpigmentation: Secondary | ICD-10-CM | POA: Diagnosis not present

## 2021-02-19 DIAGNOSIS — L820 Inflamed seborrheic keratosis: Secondary | ICD-10-CM | POA: Diagnosis not present

## 2021-02-19 DIAGNOSIS — D225 Melanocytic nevi of trunk: Secondary | ICD-10-CM | POA: Diagnosis not present

## 2021-02-19 DIAGNOSIS — D485 Neoplasm of uncertain behavior of skin: Secondary | ICD-10-CM | POA: Diagnosis not present

## 2021-03-05 DIAGNOSIS — C44722 Squamous cell carcinoma of skin of right lower limb, including hip: Secondary | ICD-10-CM | POA: Diagnosis not present

## 2021-03-13 DIAGNOSIS — M545 Low back pain, unspecified: Secondary | ICD-10-CM | POA: Diagnosis not present

## 2021-03-13 DIAGNOSIS — S81801A Unspecified open wound, right lower leg, initial encounter: Secondary | ICD-10-CM | POA: Diagnosis not present

## 2021-03-13 DIAGNOSIS — Z6830 Body mass index (BMI) 30.0-30.9, adult: Secondary | ICD-10-CM | POA: Diagnosis not present

## 2021-03-26 ENCOUNTER — Ambulatory Visit: Payer: BC Managed Care – PPO | Admitting: Endocrinology

## 2021-04-08 DIAGNOSIS — M545 Low back pain, unspecified: Secondary | ICD-10-CM | POA: Diagnosis not present

## 2021-04-08 DIAGNOSIS — J069 Acute upper respiratory infection, unspecified: Secondary | ICD-10-CM | POA: Diagnosis not present

## 2021-04-25 ENCOUNTER — Ambulatory Visit: Payer: BC Managed Care – PPO | Admitting: Endocrinology

## 2021-05-07 DIAGNOSIS — Z7184 Encounter for health counseling related to travel: Secondary | ICD-10-CM | POA: Diagnosis not present

## 2021-05-07 DIAGNOSIS — Z6830 Body mass index (BMI) 30.0-30.9, adult: Secondary | ICD-10-CM | POA: Diagnosis not present

## 2021-05-07 DIAGNOSIS — M545 Low back pain, unspecified: Secondary | ICD-10-CM | POA: Diagnosis not present

## 2021-06-19 DIAGNOSIS — B079 Viral wart, unspecified: Secondary | ICD-10-CM | POA: Diagnosis not present

## 2021-06-19 DIAGNOSIS — C44722 Squamous cell carcinoma of skin of right lower limb, including hip: Secondary | ICD-10-CM | POA: Diagnosis not present

## 2021-06-24 DIAGNOSIS — E119 Type 2 diabetes mellitus without complications: Secondary | ICD-10-CM | POA: Diagnosis not present

## 2021-06-25 DIAGNOSIS — K219 Gastro-esophageal reflux disease without esophagitis: Secondary | ICD-10-CM | POA: Diagnosis not present

## 2021-06-25 DIAGNOSIS — E559 Vitamin D deficiency, unspecified: Secondary | ICD-10-CM | POA: Diagnosis not present

## 2021-06-25 DIAGNOSIS — Z6829 Body mass index (BMI) 29.0-29.9, adult: Secondary | ICD-10-CM | POA: Diagnosis not present

## 2021-06-25 DIAGNOSIS — M545 Low back pain, unspecified: Secondary | ICD-10-CM | POA: Diagnosis not present

## 2021-06-25 DIAGNOSIS — E538 Deficiency of other specified B group vitamins: Secondary | ICD-10-CM | POA: Diagnosis not present

## 2021-06-25 DIAGNOSIS — E119 Type 2 diabetes mellitus without complications: Secondary | ICD-10-CM | POA: Diagnosis not present

## 2021-06-25 DIAGNOSIS — F17200 Nicotine dependence, unspecified, uncomplicated: Secondary | ICD-10-CM | POA: Diagnosis not present

## 2021-06-25 DIAGNOSIS — Z79899 Other long term (current) drug therapy: Secondary | ICD-10-CM | POA: Diagnosis not present

## 2021-07-03 ENCOUNTER — Ambulatory Visit: Payer: BC Managed Care – PPO | Admitting: Dietician

## 2021-07-22 DIAGNOSIS — M545 Low back pain, unspecified: Secondary | ICD-10-CM | POA: Diagnosis not present

## 2021-07-22 DIAGNOSIS — Z6830 Body mass index (BMI) 30.0-30.9, adult: Secondary | ICD-10-CM | POA: Diagnosis not present

## 2021-07-22 DIAGNOSIS — R059 Cough, unspecified: Secondary | ICD-10-CM | POA: Diagnosis not present

## 2021-07-22 DIAGNOSIS — F419 Anxiety disorder, unspecified: Secondary | ICD-10-CM | POA: Diagnosis not present

## 2021-08-19 DIAGNOSIS — Z6829 Body mass index (BMI) 29.0-29.9, adult: Secondary | ICD-10-CM | POA: Diagnosis not present

## 2021-08-19 DIAGNOSIS — L84 Corns and callosities: Secondary | ICD-10-CM | POA: Diagnosis not present

## 2021-08-19 DIAGNOSIS — K219 Gastro-esophageal reflux disease without esophagitis: Secondary | ICD-10-CM | POA: Diagnosis not present

## 2021-08-19 DIAGNOSIS — M545 Low back pain, unspecified: Secondary | ICD-10-CM | POA: Diagnosis not present

## 2021-09-05 DIAGNOSIS — M25562 Pain in left knee: Secondary | ICD-10-CM | POA: Diagnosis not present

## 2021-09-05 DIAGNOSIS — M25561 Pain in right knee: Secondary | ICD-10-CM | POA: Diagnosis not present

## 2021-09-05 DIAGNOSIS — M170 Bilateral primary osteoarthritis of knee: Secondary | ICD-10-CM | POA: Diagnosis not present

## 2021-09-18 DIAGNOSIS — B370 Candidal stomatitis: Secondary | ICD-10-CM | POA: Diagnosis not present

## 2021-09-18 DIAGNOSIS — Z6829 Body mass index (BMI) 29.0-29.9, adult: Secondary | ICD-10-CM | POA: Diagnosis not present

## 2021-09-18 DIAGNOSIS — M545 Low back pain, unspecified: Secondary | ICD-10-CM | POA: Diagnosis not present

## 2021-10-20 DIAGNOSIS — E785 Hyperlipidemia, unspecified: Secondary | ICD-10-CM | POA: Diagnosis not present

## 2021-10-20 DIAGNOSIS — Z6829 Body mass index (BMI) 29.0-29.9, adult: Secondary | ICD-10-CM | POA: Diagnosis not present

## 2021-10-20 DIAGNOSIS — R110 Nausea: Secondary | ICD-10-CM | POA: Diagnosis not present

## 2021-10-20 DIAGNOSIS — E559 Vitamin D deficiency, unspecified: Secondary | ICD-10-CM | POA: Diagnosis not present

## 2021-10-20 DIAGNOSIS — M545 Low back pain, unspecified: Secondary | ICD-10-CM | POA: Diagnosis not present

## 2021-10-20 DIAGNOSIS — E538 Deficiency of other specified B group vitamins: Secondary | ICD-10-CM | POA: Diagnosis not present

## 2021-10-20 DIAGNOSIS — E119 Type 2 diabetes mellitus without complications: Secondary | ICD-10-CM | POA: Diagnosis not present

## 2021-11-24 IMAGING — MG DIGITAL DIAGNOSTIC BILAT W/ TOMO W/ CAD
7 of 16 series · 7 of 40 positions shown · non-contrast
Comparison: Previous exam(s).
COMPARISON: Previous exam(s).

Addendum:
CLINICAL DATA: 64-year-old female presenting with new retroareolar
left breast pain. Patient has a family history of breast cancer in
her mother.

EXAM:
DIGITAL DIAGNOSTIC BILATERAL MAMMOGRAM WITH CAD AND TOMO
ULTRASOUND LEFT BREAST

[R CC synth-2D (1 of 3)]
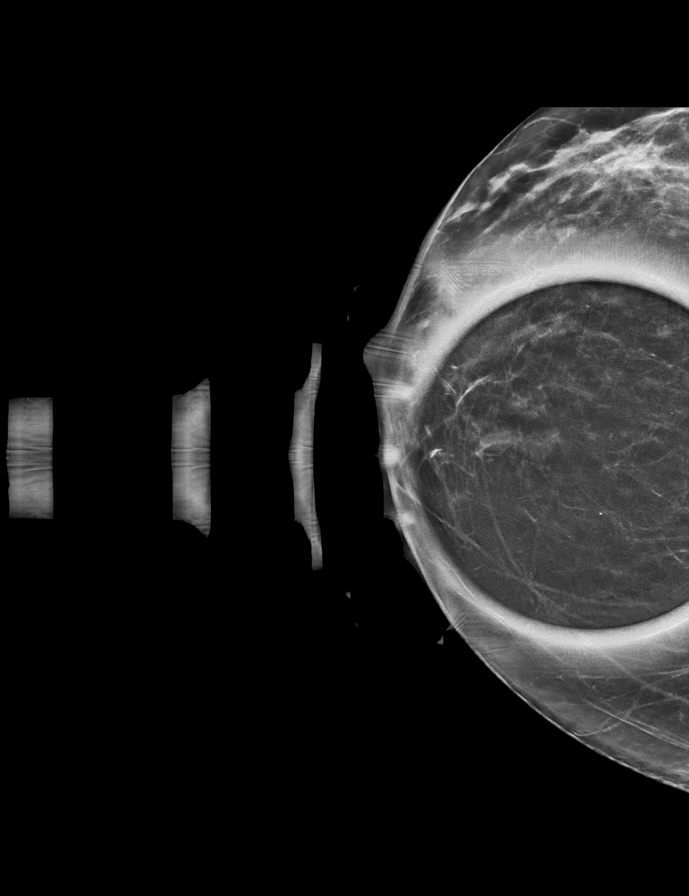

[R ML synth-2D]
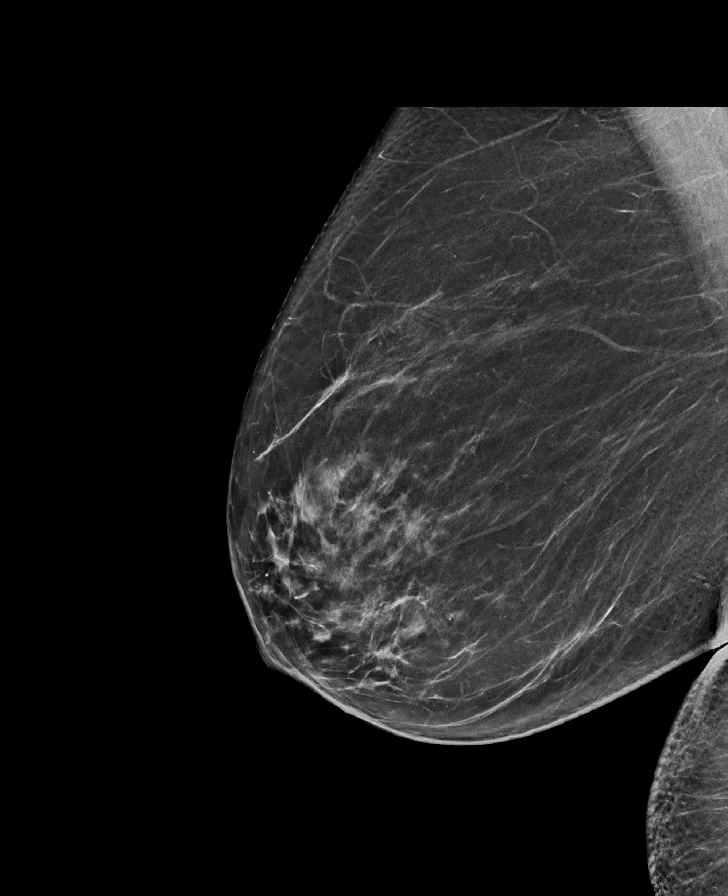

[R MLO synth-2D (1 of 2)]
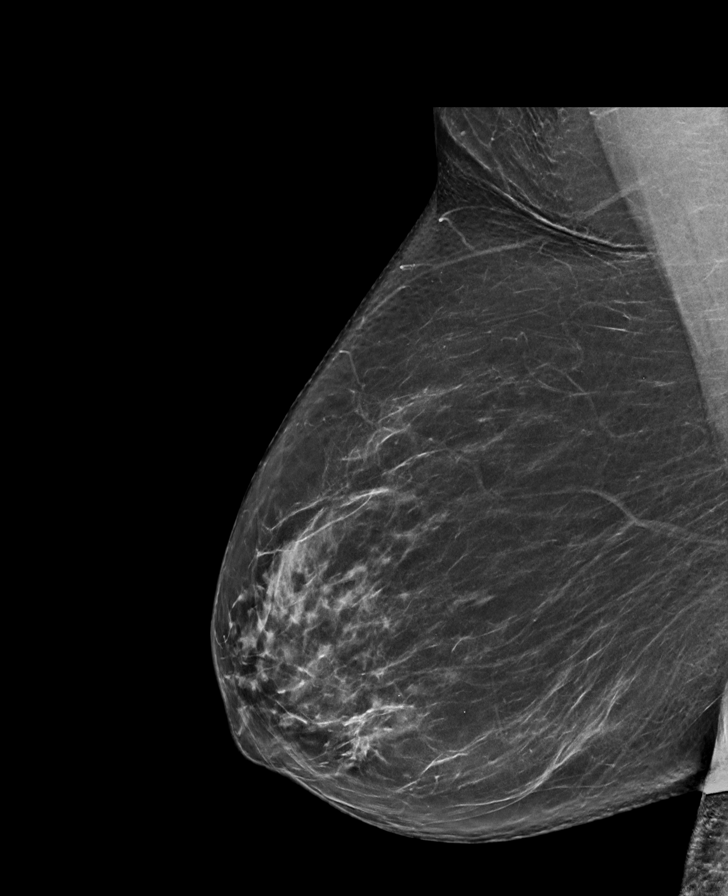

[R CC synth-2D (2 of 3)]
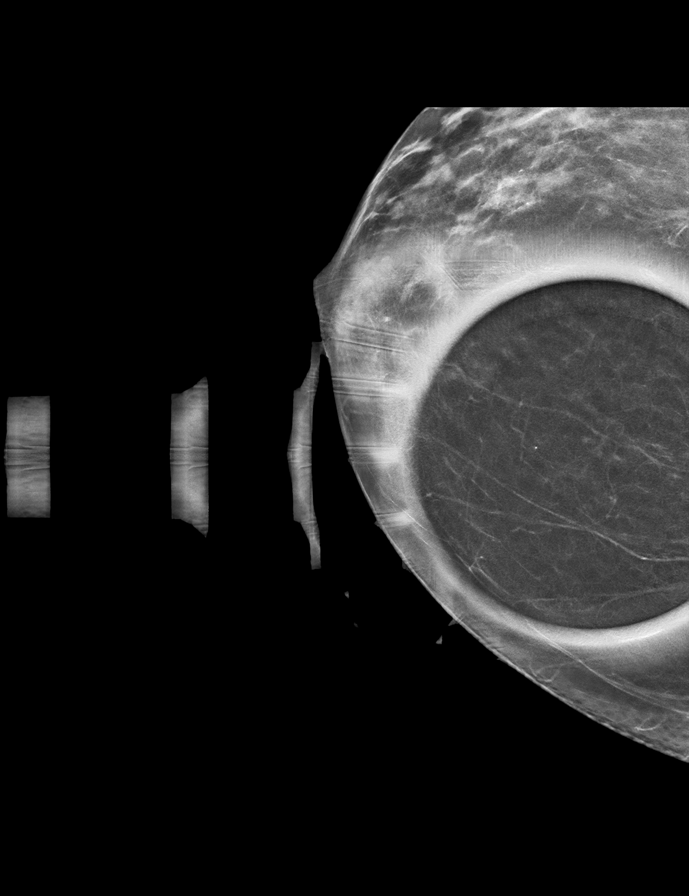

[L CC synth-2D]
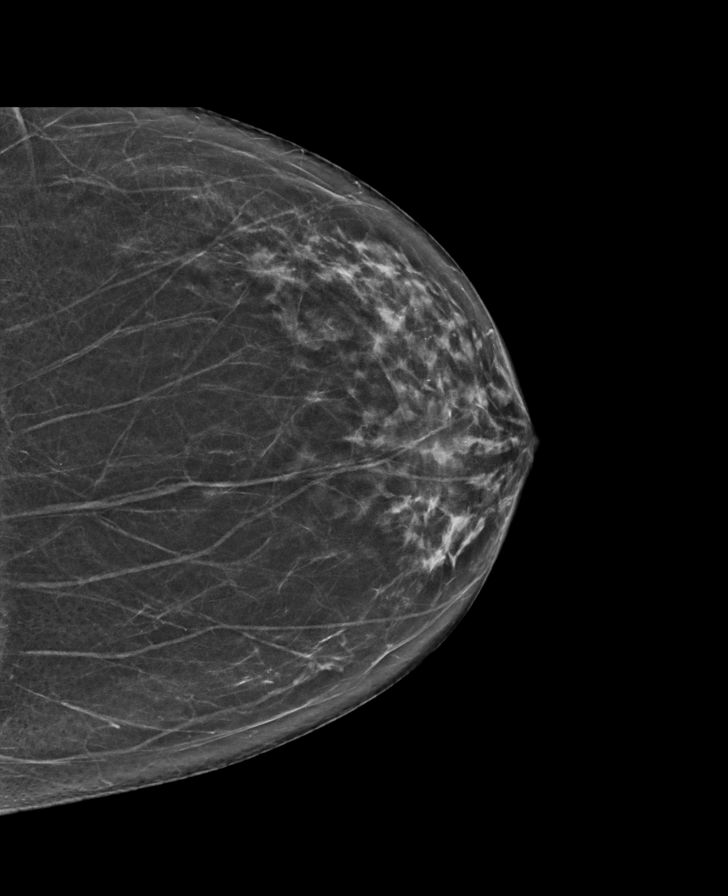

[R MLO synth-2D (2 of 2)]
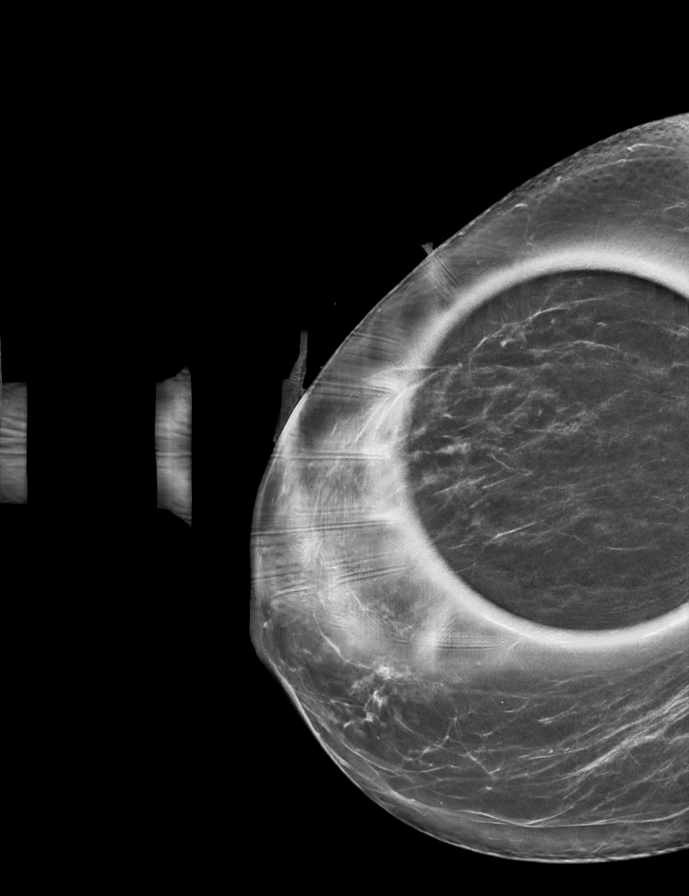

[R CC synth-2D (3 of 3)]
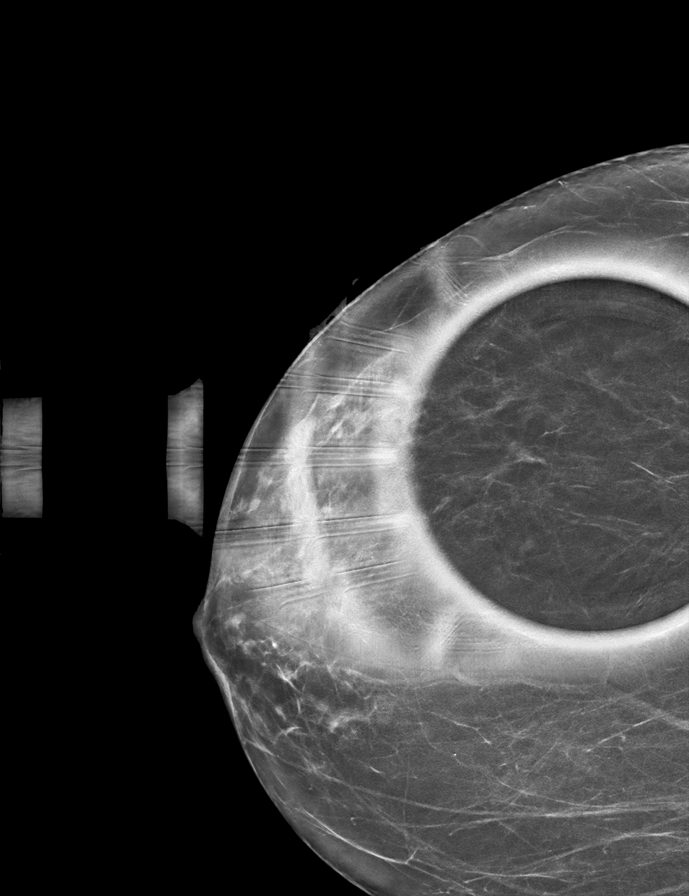

[7 of 40 positions shown; findings below may reference images not displayed]

ACR Breast Density Category b: There are scattered areas of
fibroglandular density.
FINDINGS: Mammogram:

Right breast: In the upper outer right breast middle depth best seen
on the cc view there is a small asymmetry which appears less
conspicuous on spot compression views. There are no additional new
findings in the right breast.

Left breast: No suspicious mass, distortion, or microcalcifications
are identified to suggest presence of malignancy. Specifically there
is no new abnormality in the retroareolar left breast.

Mammographic images were processed with CAD.

Ultrasound:

Targeted ultrasound is performed in the upper-outer quadrant of the
right breast demonstrating no suspicious cystic or solid mass or
other finding to correspond to the asymmetry seen on mammogram.

Targeted ultrasound is performed in the left retroareolar region
demonstrating no cystic or solid mass. No dilated duct or fluid
collection.
IMPRESSION: 1. Probably benign asymmetry in the upper outer right breast,
without sonographic correlate.

2. No mammographic or sonographic evidence of malignancy or other
finding to explain the patient's left retroareolar breast pain.

RECOMMENDATION:
1.  Diagnostic right breast mammogram in 6 months.

2. Clinical follow-up as needed for the left breast pain. We
discussed some of the issues regarding breast pain, including
limiting caffeine, wearing adequate support, over-the-counter pain
medication, low-fat diet, exercise, and ice as needed.

I have discussed the findings and recommendations with the patient.
If applicable, a reminder letter will be sent to the patient
regarding the next appointment.

BI-RADS CATEGORY  3: Probably benign.

EXAM:
DIGITAL DIAGNOSTIC BILATERAL MAMMOGRAM WITH CAD AND TOMO

ULTRASOUND BILATERAL BREAST

*** End of Addendum ***
ACR Breast Density Category b: There are scattered areas of
fibroglandular density.
FINDINGS: Mammogram:

Right breast: In the upper outer right breast middle depth best seen
on the cc view there is a small asymmetry which appears less
conspicuous on spot compression views. There are no additional new
findings in the right breast.

Left breast: No suspicious mass, distortion, or microcalcifications
are identified to suggest presence of malignancy. Specifically there
is no new abnormality in the retroareolar left breast.

Mammographic images were processed with CAD.

Ultrasound:

Targeted ultrasound is performed in the upper-outer quadrant of the
right breast demonstrating no suspicious cystic or solid mass or
other finding to correspond to the asymmetry seen on mammogram.

Targeted ultrasound is performed in the left retroareolar region
demonstrating no cystic or solid mass. No dilated duct or fluid
collection.
IMPRESSION: 1. Probably benign asymmetry in the upper outer right breast,
without sonographic correlate.

2. No mammographic or sonographic evidence of malignancy or other
finding to explain the patient's left retroareolar breast pain.

RECOMMENDATION:
1.  Diagnostic right breast mammogram in 6 months.

2. Clinical follow-up as needed for the left breast pain. We
discussed some of the issues regarding breast pain, including
limiting caffeine, wearing adequate support, over-the-counter pain
medication, low-fat diet, exercise, and ice as needed.

I have discussed the findings and recommendations with the patient.
If applicable, a reminder letter will be sent to the patient
regarding the next appointment.

BI-RADS CATEGORY  3: Probably benign.

## 2021-11-27 DIAGNOSIS — M545 Low back pain, unspecified: Secondary | ICD-10-CM | POA: Diagnosis not present

## 2021-11-27 DIAGNOSIS — E538 Deficiency of other specified B group vitamins: Secondary | ICD-10-CM | POA: Diagnosis not present

## 2021-11-27 DIAGNOSIS — Z6829 Body mass index (BMI) 29.0-29.9, adult: Secondary | ICD-10-CM | POA: Diagnosis not present

## 2021-12-08 DIAGNOSIS — M79672 Pain in left foot: Secondary | ICD-10-CM | POA: Diagnosis not present

## 2021-12-08 DIAGNOSIS — S93602A Unspecified sprain of left foot, initial encounter: Secondary | ICD-10-CM | POA: Diagnosis not present

## 2021-12-23 DIAGNOSIS — M79672 Pain in left foot: Secondary | ICD-10-CM | POA: Diagnosis not present

## 2021-12-25 DIAGNOSIS — M545 Low back pain, unspecified: Secondary | ICD-10-CM | POA: Diagnosis not present

## 2021-12-25 DIAGNOSIS — J9801 Acute bronchospasm: Secondary | ICD-10-CM | POA: Diagnosis not present

## 2021-12-25 DIAGNOSIS — Z6829 Body mass index (BMI) 29.0-29.9, adult: Secondary | ICD-10-CM | POA: Diagnosis not present

## 2022-02-18 DIAGNOSIS — L820 Inflamed seborrheic keratosis: Secondary | ICD-10-CM | POA: Diagnosis not present

## 2022-02-18 DIAGNOSIS — B86 Scabies: Secondary | ICD-10-CM | POA: Diagnosis not present

## 2022-02-18 DIAGNOSIS — D2239 Melanocytic nevi of other parts of face: Secondary | ICD-10-CM | POA: Diagnosis not present

## 2022-02-18 DIAGNOSIS — D485 Neoplasm of uncertain behavior of skin: Secondary | ICD-10-CM | POA: Diagnosis not present

## 2022-02-18 DIAGNOSIS — D225 Melanocytic nevi of trunk: Secondary | ICD-10-CM | POA: Diagnosis not present

## 2022-02-18 DIAGNOSIS — L570 Actinic keratosis: Secondary | ICD-10-CM | POA: Diagnosis not present

## 2022-02-18 DIAGNOSIS — L814 Other melanin hyperpigmentation: Secondary | ICD-10-CM | POA: Diagnosis not present

## 2022-03-02 DIAGNOSIS — Z79899 Other long term (current) drug therapy: Secondary | ICD-10-CM | POA: Diagnosis not present

## 2022-03-02 DIAGNOSIS — Z1231 Encounter for screening mammogram for malignant neoplasm of breast: Secondary | ICD-10-CM | POA: Diagnosis not present

## 2022-03-02 DIAGNOSIS — E559 Vitamin D deficiency, unspecified: Secondary | ICD-10-CM | POA: Diagnosis not present

## 2022-03-02 DIAGNOSIS — E785 Hyperlipidemia, unspecified: Secondary | ICD-10-CM | POA: Diagnosis not present

## 2022-03-02 DIAGNOSIS — E538 Deficiency of other specified B group vitamins: Secondary | ICD-10-CM | POA: Diagnosis not present

## 2022-03-02 DIAGNOSIS — E119 Type 2 diabetes mellitus without complications: Secondary | ICD-10-CM | POA: Diagnosis not present

## 2022-03-02 DIAGNOSIS — M545 Low back pain, unspecified: Secondary | ICD-10-CM | POA: Diagnosis not present

## 2022-03-02 DIAGNOSIS — Z6829 Body mass index (BMI) 29.0-29.9, adult: Secondary | ICD-10-CM | POA: Diagnosis not present

## 2022-03-02 DIAGNOSIS — K219 Gastro-esophageal reflux disease without esophagitis: Secondary | ICD-10-CM | POA: Diagnosis not present

## 2022-03-03 ENCOUNTER — Other Ambulatory Visit: Payer: Self-pay | Admitting: Nurse Practitioner

## 2022-03-03 DIAGNOSIS — Z1231 Encounter for screening mammogram for malignant neoplasm of breast: Secondary | ICD-10-CM

## 2022-03-06 ENCOUNTER — Other Ambulatory Visit: Payer: Self-pay | Admitting: Nurse Practitioner

## 2022-03-06 DIAGNOSIS — N6489 Other specified disorders of breast: Secondary | ICD-10-CM

## 2022-03-18 ENCOUNTER — Ambulatory Visit
Admission: RE | Admit: 2022-03-18 | Discharge: 2022-03-18 | Disposition: A | Discharge status: Home / Self Care | Payer: BC Managed Care – PPO | Source: Ambulatory Visit | Attending: Nurse Practitioner | Admitting: Nurse Practitioner

## 2022-03-18 DIAGNOSIS — N6489 Other specified disorders of breast: Secondary | ICD-10-CM

## 2022-04-01 DIAGNOSIS — M545 Low back pain, unspecified: Secondary | ICD-10-CM | POA: Diagnosis not present

## 2022-04-01 DIAGNOSIS — Z6830 Body mass index (BMI) 30.0-30.9, adult: Secondary | ICD-10-CM | POA: Diagnosis not present

## 2022-05-04 DIAGNOSIS — Z7184 Encounter for health counseling related to travel: Secondary | ICD-10-CM | POA: Diagnosis not present

## 2022-05-04 DIAGNOSIS — L989 Disorder of the skin and subcutaneous tissue, unspecified: Secondary | ICD-10-CM | POA: Diagnosis not present

## 2022-05-04 DIAGNOSIS — Z6829 Body mass index (BMI) 29.0-29.9, adult: Secondary | ICD-10-CM | POA: Diagnosis not present

## 2022-05-04 DIAGNOSIS — M545 Low back pain, unspecified: Secondary | ICD-10-CM | POA: Diagnosis not present

## 2022-06-29 DIAGNOSIS — Z79899 Other long term (current) drug therapy: Secondary | ICD-10-CM | POA: Diagnosis not present

## 2022-06-29 DIAGNOSIS — M545 Low back pain, unspecified: Secondary | ICD-10-CM | POA: Diagnosis not present

## 2022-06-29 DIAGNOSIS — K219 Gastro-esophageal reflux disease without esophagitis: Secondary | ICD-10-CM | POA: Diagnosis not present

## 2022-06-29 DIAGNOSIS — F419 Anxiety disorder, unspecified: Secondary | ICD-10-CM | POA: Diagnosis not present

## 2022-06-29 DIAGNOSIS — E785 Hyperlipidemia, unspecified: Secondary | ICD-10-CM | POA: Diagnosis not present

## 2022-06-29 DIAGNOSIS — E538 Deficiency of other specified B group vitamins: Secondary | ICD-10-CM | POA: Diagnosis not present

## 2022-06-29 DIAGNOSIS — E119 Type 2 diabetes mellitus without complications: Secondary | ICD-10-CM | POA: Diagnosis not present

## 2022-06-29 DIAGNOSIS — E559 Vitamin D deficiency, unspecified: Secondary | ICD-10-CM | POA: Diagnosis not present

## 2022-07-13 DIAGNOSIS — E1165 Type 2 diabetes mellitus with hyperglycemia: Secondary | ICD-10-CM | POA: Diagnosis not present

## 2022-07-13 DIAGNOSIS — Z833 Family history of diabetes mellitus: Secondary | ICD-10-CM | POA: Diagnosis not present

## 2022-07-28 DIAGNOSIS — M5412 Radiculopathy, cervical region: Secondary | ICD-10-CM | POA: Diagnosis not present

## 2022-07-28 DIAGNOSIS — Z6828 Body mass index (BMI) 28.0-28.9, adult: Secondary | ICD-10-CM | POA: Diagnosis not present

## 2022-07-28 DIAGNOSIS — M545 Low back pain, unspecified: Secondary | ICD-10-CM | POA: Diagnosis not present

## 2022-08-06 ENCOUNTER — Other Ambulatory Visit: Payer: Self-pay | Admitting: Nurse Practitioner

## 2022-08-06 ENCOUNTER — Ambulatory Visit
Admission: RE | Admit: 2022-08-06 | Discharge: 2022-08-06 | Disposition: A | Discharge status: Home / Self Care | Payer: No Typology Code available for payment source | Source: Ambulatory Visit | Attending: Nurse Practitioner | Admitting: Nurse Practitioner

## 2022-08-06 DIAGNOSIS — M5010 Cervical disc disorder with radiculopathy, unspecified cervical region: Secondary | ICD-10-CM

## 2022-08-06 DIAGNOSIS — M5412 Radiculopathy, cervical region: Secondary | ICD-10-CM

## 2022-08-26 DIAGNOSIS — Z6828 Body mass index (BMI) 28.0-28.9, adult: Secondary | ICD-10-CM | POA: Diagnosis not present

## 2022-08-26 DIAGNOSIS — M545 Low back pain, unspecified: Secondary | ICD-10-CM | POA: Diagnosis not present

## 2022-09-23 DIAGNOSIS — M545 Low back pain, unspecified: Secondary | ICD-10-CM | POA: Diagnosis not present

## 2022-09-23 DIAGNOSIS — E538 Deficiency of other specified B group vitamins: Secondary | ICD-10-CM | POA: Diagnosis not present

## 2022-10-14 DIAGNOSIS — L570 Actinic keratosis: Secondary | ICD-10-CM | POA: Diagnosis not present

## 2022-10-22 DIAGNOSIS — M545 Low back pain, unspecified: Secondary | ICD-10-CM | POA: Diagnosis not present

## 2022-10-22 DIAGNOSIS — E538 Deficiency of other specified B group vitamins: Secondary | ICD-10-CM | POA: Diagnosis not present

## 2022-10-22 DIAGNOSIS — J9801 Acute bronchospasm: Secondary | ICD-10-CM | POA: Diagnosis not present

## 2022-10-22 DIAGNOSIS — E559 Vitamin D deficiency, unspecified: Secondary | ICD-10-CM | POA: Diagnosis not present

## 2022-10-22 DIAGNOSIS — M25511 Pain in right shoulder: Secondary | ICD-10-CM | POA: Diagnosis not present

## 2022-10-22 DIAGNOSIS — Z79899 Other long term (current) drug therapy: Secondary | ICD-10-CM | POA: Diagnosis not present

## 2022-10-22 DIAGNOSIS — Z9181 History of falling: Secondary | ICD-10-CM | POA: Diagnosis not present

## 2022-10-22 DIAGNOSIS — K219 Gastro-esophageal reflux disease without esophagitis: Secondary | ICD-10-CM | POA: Diagnosis not present

## 2022-10-22 DIAGNOSIS — E119 Type 2 diabetes mellitus without complications: Secondary | ICD-10-CM | POA: Diagnosis not present

## 2022-10-22 DIAGNOSIS — E785 Hyperlipidemia, unspecified: Secondary | ICD-10-CM | POA: Diagnosis not present

## 2022-11-23 DIAGNOSIS — M545 Low back pain, unspecified: Secondary | ICD-10-CM | POA: Diagnosis not present

## 2022-12-21 DIAGNOSIS — E119 Type 2 diabetes mellitus without complications: Secondary | ICD-10-CM | POA: Diagnosis not present

## 2022-12-21 DIAGNOSIS — H524 Presbyopia: Secondary | ICD-10-CM | POA: Diagnosis not present

## 2022-12-23 DIAGNOSIS — M545 Low back pain, unspecified: Secondary | ICD-10-CM | POA: Diagnosis not present

## 2023-01-21 DIAGNOSIS — M545 Low back pain, unspecified: Secondary | ICD-10-CM | POA: Diagnosis not present

## 2023-02-18 DIAGNOSIS — E559 Vitamin D deficiency, unspecified: Secondary | ICD-10-CM | POA: Diagnosis not present

## 2023-02-18 DIAGNOSIS — R110 Nausea: Secondary | ICD-10-CM | POA: Diagnosis not present

## 2023-02-18 DIAGNOSIS — E785 Hyperlipidemia, unspecified: Secondary | ICD-10-CM | POA: Diagnosis not present

## 2023-02-18 DIAGNOSIS — K219 Gastro-esophageal reflux disease without esophagitis: Secondary | ICD-10-CM | POA: Diagnosis not present

## 2023-02-18 DIAGNOSIS — M545 Low back pain, unspecified: Secondary | ICD-10-CM | POA: Diagnosis not present

## 2023-02-18 DIAGNOSIS — E119 Type 2 diabetes mellitus without complications: Secondary | ICD-10-CM | POA: Diagnosis not present

## 2023-02-18 DIAGNOSIS — Z79899 Other long term (current) drug therapy: Secondary | ICD-10-CM | POA: Diagnosis not present

## 2023-03-01 DIAGNOSIS — Z9181 History of falling: Secondary | ICD-10-CM | POA: Diagnosis not present

## 2023-03-01 DIAGNOSIS — Z0000 Encounter for general adult medical examination without abnormal findings: Secondary | ICD-10-CM | POA: Diagnosis not present

## 2023-03-01 DIAGNOSIS — Z139 Encounter for screening, unspecified: Secondary | ICD-10-CM | POA: Diagnosis not present

## 2023-03-03 DIAGNOSIS — E875 Hyperkalemia: Secondary | ICD-10-CM | POA: Diagnosis not present

## 2023-03-29 ENCOUNTER — Other Ambulatory Visit: Payer: Self-pay | Admitting: Nurse Practitioner

## 2023-03-29 DIAGNOSIS — M545 Low back pain, unspecified: Secondary | ICD-10-CM | POA: Diagnosis not present

## 2023-03-29 DIAGNOSIS — Z1231 Encounter for screening mammogram for malignant neoplasm of breast: Secondary | ICD-10-CM | POA: Diagnosis not present

## 2023-03-30 DIAGNOSIS — L821 Other seborrheic keratosis: Secondary | ICD-10-CM | POA: Diagnosis not present

## 2023-03-30 DIAGNOSIS — L301 Dyshidrosis [pompholyx]: Secondary | ICD-10-CM | POA: Diagnosis not present

## 2023-03-30 DIAGNOSIS — D225 Melanocytic nevi of trunk: Secondary | ICD-10-CM | POA: Diagnosis not present

## 2023-03-30 DIAGNOSIS — L720 Epidermal cyst: Secondary | ICD-10-CM | POA: Diagnosis not present

## 2023-04-08 ENCOUNTER — Encounter: Payer: Self-pay | Admitting: Internal Medicine

## 2023-04-09 DIAGNOSIS — R1310 Dysphagia, unspecified: Secondary | ICD-10-CM | POA: Diagnosis not present

## 2023-04-09 DIAGNOSIS — R09A2 Foreign body sensation, throat: Secondary | ICD-10-CM | POA: Diagnosis not present

## 2023-04-09 DIAGNOSIS — H6123 Impacted cerumen, bilateral: Secondary | ICD-10-CM | POA: Diagnosis not present

## 2023-04-26 DIAGNOSIS — L301 Dyshidrosis [pompholyx]: Secondary | ICD-10-CM | POA: Diagnosis not present

## 2023-04-26 DIAGNOSIS — M19011 Primary osteoarthritis, right shoulder: Secondary | ICD-10-CM | POA: Diagnosis not present

## 2023-04-26 DIAGNOSIS — M545 Low back pain, unspecified: Secondary | ICD-10-CM | POA: Diagnosis not present

## 2023-04-26 DIAGNOSIS — Z7184 Encounter for health counseling related to travel: Secondary | ICD-10-CM | POA: Diagnosis not present

## 2023-06-22 DIAGNOSIS — E559 Vitamin D deficiency, unspecified: Secondary | ICD-10-CM | POA: Diagnosis not present

## 2023-06-22 DIAGNOSIS — J9801 Acute bronchospasm: Secondary | ICD-10-CM | POA: Diagnosis not present

## 2023-06-22 DIAGNOSIS — E538 Deficiency of other specified B group vitamins: Secondary | ICD-10-CM | POA: Diagnosis not present

## 2023-06-22 DIAGNOSIS — M545 Low back pain, unspecified: Secondary | ICD-10-CM | POA: Diagnosis not present

## 2023-06-22 DIAGNOSIS — E785 Hyperlipidemia, unspecified: Secondary | ICD-10-CM | POA: Diagnosis not present

## 2023-06-22 DIAGNOSIS — E119 Type 2 diabetes mellitus without complications: Secondary | ICD-10-CM | POA: Diagnosis not present

## 2023-06-22 DIAGNOSIS — K219 Gastro-esophageal reflux disease without esophagitis: Secondary | ICD-10-CM | POA: Diagnosis not present

## 2023-06-22 DIAGNOSIS — J40 Bronchitis, not specified as acute or chronic: Secondary | ICD-10-CM | POA: Diagnosis not present

## 2023-07-01 ENCOUNTER — Ambulatory Visit
Admission: RE | Admit: 2023-07-01 | Discharge: 2023-07-01 | Disposition: A | Discharge status: Home / Self Care | Payer: Medicare Other | Source: Ambulatory Visit | Attending: Nurse Practitioner | Admitting: Nurse Practitioner

## 2023-07-01 DIAGNOSIS — Z1231 Encounter for screening mammogram for malignant neoplasm of breast: Secondary | ICD-10-CM | POA: Diagnosis not present

## 2023-07-20 DIAGNOSIS — Z79899 Other long term (current) drug therapy: Secondary | ICD-10-CM | POA: Diagnosis not present

## 2023-07-20 DIAGNOSIS — M545 Low back pain, unspecified: Secondary | ICD-10-CM | POA: Diagnosis not present

## 2023-07-20 DIAGNOSIS — E119 Type 2 diabetes mellitus without complications: Secondary | ICD-10-CM | POA: Diagnosis not present

## 2023-07-20 DIAGNOSIS — G629 Polyneuropathy, unspecified: Secondary | ICD-10-CM | POA: Diagnosis not present

## 2023-08-17 DIAGNOSIS — M545 Low back pain, unspecified: Secondary | ICD-10-CM | POA: Diagnosis not present

## 2023-09-16 DIAGNOSIS — I10 Essential (primary) hypertension: Secondary | ICD-10-CM | POA: Diagnosis not present

## 2023-09-16 DIAGNOSIS — M545 Low back pain, unspecified: Secondary | ICD-10-CM | POA: Diagnosis not present

## 2023-09-21 DIAGNOSIS — M8580 Other specified disorders of bone density and structure, unspecified site: Secondary | ICD-10-CM | POA: Diagnosis not present

## 2023-09-21 DIAGNOSIS — M85852 Other specified disorders of bone density and structure, left thigh: Secondary | ICD-10-CM | POA: Diagnosis not present

## 2023-09-21 DIAGNOSIS — Z780 Asymptomatic menopausal state: Secondary | ICD-10-CM | POA: Diagnosis not present

## 2023-10-05 DIAGNOSIS — G459 Transient cerebral ischemic attack, unspecified: Secondary | ICD-10-CM | POA: Diagnosis not present

## 2023-10-05 DIAGNOSIS — G5731 Lesion of lateral popliteal nerve, right lower limb: Secondary | ICD-10-CM | POA: Diagnosis not present

## 2023-10-18 DIAGNOSIS — K219 Gastro-esophageal reflux disease without esophagitis: Secondary | ICD-10-CM | POA: Diagnosis not present

## 2023-10-18 DIAGNOSIS — Z79899 Other long term (current) drug therapy: Secondary | ICD-10-CM | POA: Diagnosis not present

## 2023-10-18 DIAGNOSIS — E119 Type 2 diabetes mellitus without complications: Secondary | ICD-10-CM | POA: Diagnosis not present

## 2023-10-18 DIAGNOSIS — E538 Deficiency of other specified B group vitamins: Secondary | ICD-10-CM | POA: Diagnosis not present

## 2023-10-18 DIAGNOSIS — E785 Hyperlipidemia, unspecified: Secondary | ICD-10-CM | POA: Diagnosis not present

## 2023-10-18 DIAGNOSIS — E559 Vitamin D deficiency, unspecified: Secondary | ICD-10-CM | POA: Diagnosis not present

## 2023-10-18 DIAGNOSIS — M545 Low back pain, unspecified: Secondary | ICD-10-CM | POA: Diagnosis not present

## 2023-10-18 DIAGNOSIS — R110 Nausea: Secondary | ICD-10-CM | POA: Diagnosis not present

## 2023-10-18 DIAGNOSIS — J9801 Acute bronchospasm: Secondary | ICD-10-CM | POA: Diagnosis not present

## 2023-10-18 DIAGNOSIS — I10 Essential (primary) hypertension: Secondary | ICD-10-CM | POA: Diagnosis not present

## 2023-11-15 DIAGNOSIS — L989 Disorder of the skin and subcutaneous tissue, unspecified: Secondary | ICD-10-CM | POA: Diagnosis not present

## 2023-11-15 DIAGNOSIS — M545 Low back pain, unspecified: Secondary | ICD-10-CM | POA: Diagnosis not present
